# Patient Record
Sex: Male | Born: 1995 | Race: White | Hispanic: No | Marital: Single | Smoking: Never smoker
Health system: Southern US, Community
[De-identification: ages and names within clinical notes are randomized; demographics above are authoritative.]

---
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
## 2016-09-14 ENCOUNTER — Ambulatory Visit (INDEPENDENT_AMBULATORY_CARE_PROVIDER_SITE_OTHER): Payer: BLUE CROSS/BLUE SHIELD | Admitting: Internal Medicine

## 2016-09-14 ENCOUNTER — Encounter: Payer: Self-pay | Admitting: Internal Medicine

## 2016-09-14 ENCOUNTER — Other Ambulatory Visit (INDEPENDENT_AMBULATORY_CARE_PROVIDER_SITE_OTHER): Payer: BLUE CROSS/BLUE SHIELD

## 2016-09-14 VITALS — BP 116/80 | HR 75 | Temp 98.7°F | Ht 72.5 in | Wt 164.0 lb

## 2016-09-14 DIAGNOSIS — Z0000 Encounter for general adult medical examination without abnormal findings: Secondary | ICD-10-CM | POA: Diagnosis not present

## 2016-09-14 DIAGNOSIS — F458 Other somatoform disorders: Secondary | ICD-10-CM

## 2016-09-14 DIAGNOSIS — R079 Chest pain, unspecified: Secondary | ICD-10-CM | POA: Insufficient documentation

## 2016-09-14 DIAGNOSIS — R519 Headache, unspecified: Secondary | ICD-10-CM | POA: Insufficient documentation

## 2016-09-14 DIAGNOSIS — Z23 Encounter for immunization: Secondary | ICD-10-CM

## 2016-09-14 DIAGNOSIS — L723 Sebaceous cyst: Secondary | ICD-10-CM | POA: Diagnosis not present

## 2016-09-14 DIAGNOSIS — G4489 Other headache syndrome: Secondary | ICD-10-CM

## 2016-09-14 DIAGNOSIS — R51 Headache: Secondary | ICD-10-CM

## 2016-09-14 LAB — BASIC METABOLIC PANEL
BUN: 13 mg/dL (ref 6–23)
CO2: 30 mEq/L (ref 19–32)
Calcium: 10.1 mg/dL (ref 8.4–10.5)
Chloride: 104 mEq/L (ref 96–112)
Creatinine, Ser: 1.04 mg/dL (ref 0.40–1.50)
GFR: 97.47 mL/min (ref >60.00)
Glucose, Bld: 98 mg/dL (ref 70–99)
Potassium: 4.9 mEq/L (ref 3.5–5.1)
Sodium: 140 mEq/L (ref 135–145)

## 2016-09-14 LAB — HEPATIC FUNCTION PANEL
ALT: 20 U/L (ref 0–53)
AST: 19 U/L (ref 0–37)
Albumin: 4.9 g/dL (ref 3.5–5.2)
Alkaline Phosphatase: 62 U/L (ref 52–171)
Bilirubin, Direct: 0.2 mg/dL (ref 0.0–0.3)
Total Bilirubin: 0.8 mg/dL (ref 0.2–1.2)
Total Protein: 8 g/dL (ref 6.0–8.3)

## 2016-09-14 LAB — CBC WITH DIFFERENTIAL/PLATELET
Basophils Absolute: 0 10*3/uL (ref 0.0–0.1)
Basophils Relative: 0.6 % (ref 0.0–3.0)
Eosinophils Absolute: 0.2 10*3/uL (ref 0.0–0.7)
Eosinophils Relative: 3 % (ref 0.0–5.0)
HCT: 47.4 % (ref 36.0–49.0)
Hemoglobin: 16.6 g/dL — ABNORMAL HIGH (ref 12.0–16.0)
Lymphocytes Relative: 33.8 % (ref 24.0–48.0)
Lymphs Abs: 2.1 10*3/uL (ref 0.7–4.0)
MCHC: 35.1 g/dL (ref 31.0–37.0)
MCV: 83.4 fl (ref 78.0–98.0)
Monocytes Absolute: 0.4 10*3/uL (ref 0.1–1.0)
Monocytes Relative: 5.8 % (ref 3.0–12.0)
Neutro Abs: 3.5 10*3/uL (ref 1.4–7.7)
Neutrophils Relative %: 56.8 % (ref 43.0–71.0)
Platelets: 242 10*3/uL (ref 150.0–575.0)
RBC: 5.69 Mil/uL (ref 3.80–5.70)
RDW: 12.5 % (ref 11.4–15.5)
WBC: 6.2 10*3/uL (ref 4.5–13.5)

## 2016-09-14 LAB — LIPID PANEL
Cholesterol: 148 mg/dL (ref 0–200)
HDL: 44.6 mg/dL (ref >39.00)
LDL Cholesterol: 92 mg/dL (ref 0–99)
NonHDL: 103.58
Total CHOL/HDL Ratio: 3
Triglycerides: 57 mg/dL (ref 0.0–149.0)
VLDL: 11.4 mg/dL (ref 0.0–40.0)

## 2016-09-14 LAB — URINALYSIS
Bilirubin Urine: NEGATIVE
Hgb urine dipstick: NEGATIVE
Leukocytes, UA: NEGATIVE
Nitrite: NEGATIVE
Specific Gravity, Urine: >=1.03 — AB (ref 1.000–1.030)
Total Protein, Urine: NEGATIVE
Urine Glucose: NEGATIVE
Urobilinogen, UA: 0.2 (ref 0.0–1.0)
pH: 6 (ref 5.0–8.0)

## 2016-09-14 LAB — TSH: TSH: 1.54 u[IU]/mL (ref 0.40–5.00)

## 2016-09-14 MED ORDER — IBUPROFEN 600 MG PO TABS: 600.0000 mg | ORAL_TABLET | Freq: Three times a day (TID) | ORAL | 2 refills | Status: DC | PRN

## 2016-09-14 NOTE — Addendum Note (Signed)
Addended by: Merrilyn PumaSIMMONS, Bosten Newstrom N on: 09/14/2016 03:58 PM   Modules accepted: Orders

## 2016-09-14 NOTE — Progress Notes (Signed)
Subjective:  Patient ID: Walter Blake, male    DOB: [birth year]  Age: 20 y.o. MRN: [id number]  CC: Establish Care   HPI Walter Blake presents for a well exam - new pt C/o HA in the R temple C/o chest pain C/o teeth discomfort    No outpatient prescriptions prior to visit.   No facility-administered medications prior to visit.     ROS Review of Systems  Constitutional: Negative for appetite change, fatigue and unexpected weight change.  HENT: Negative for congestion, nosebleeds, sneezing, sore throat and trouble swallowing.   Eyes: Negative for itching and visual disturbance.  Respiratory: Negative for cough.   Cardiovascular: Negative for chest pain, palpitations and leg swelling.  Gastrointestinal: Negative for abdominal distention, blood in stool, diarrhea and nausea.  Genitourinary: Negative for frequency and hematuria.  Musculoskeletal: Negative for back pain, gait problem, joint swelling and neck pain.  Skin: Negative for rash.  Neurological: Negative for dizziness, tremors, speech difficulty and weakness.  Psychiatric/Behavioral: Negative for agitation, dysphoric mood, sleep disturbance and suicidal ideas. The patient is not nervous/anxious.     Objective:  BP 116/80   Pulse 75   Temp 98.7 F (37.1 C) (Oral)   Ht 6' 0.5" (1.842 m)   Wt 164 lb (74.4 kg)   SpO2 97%   BMI 21.94 kg/m   BP Readings from Last 3 Encounters:  09/14/16 116/80    Wt Readings from Last 3 Encounters:  09/14/16 164 lb (74.4 kg) (66 %, Z= 0.40)*   * Growth percentiles are based on CDC 2-20 Years data.    Physical Exam  Constitutional: He is oriented to person, place, and time. He appears well-developed. No distress.  NAD  HENT:  Mouth/Throat: Oropharynx is clear and moist.  Eyes: Conjunctivae are normal. Pupils are equal, round, and reactive to light.  Neck: Normal range of motion. No JVD present. No thyromegaly present.  Cardiovascular: Normal rate, regular rhythm, normal  heart sounds and intact distal pulses.  Exam reveals no gallop and no friction rub.   No murmur heard. Pulmonary/Chest: Effort normal and breath sounds normal. No respiratory distress. He has no wheezes. He has no rales. He exhibits no tenderness.  Abdominal: Soft. Bowel sounds are normal. He exhibits no distension and no mass. There is no tenderness. There is no rebound and no guarding.  Musculoskeletal: Normal range of motion. He exhibits no edema or tenderness.  Lymphadenopathy:    He has no cervical adenopathy.  Neurological: He is alert and oriented to person, place, and time. He has normal reflexes. No cranial nerve deficit. He exhibits normal muscle tone. He displays a negative Romberg sign. Coordination and gait normal.  Skin: Skin is warm and dry. No rash noted.  Psychiatric: He has a normal mood and affect. His behavior is normal. Judgment and thought content normal.    No results found for: WBC, HGB, HCT, PLT, GLUCOSE, CHOL, TRIG, HDL, LDLDIRECT, LDLCALC, ALT, AST, NA, K, CL, CREATININE, BUN, CO2, TSH, PSA, INR, GLUF, HGBA1C, MICROALBUR  Ct Pelvis W Contrast  Result Date: 04/15/2003 FINDINGS CLINICAL DATA:  ABDOMINAL PAIN, NAUSEA, AND VOMITING.  LEFT SIDED HYDRONEPHROSIS AND RETROPERITONEAL MASS SEEN ON ULTRASOUND.  NOW FOR FURTHER EVALUATION. CT ABDOMEN WITHOUT AND WITH CONTRAST AND CT PELVIS WITH CONTRAST, 04/15/03 CT WAS PERFORMED THROUGH THE ABDOMEN WITHOUT AND WITH CONTRAST AND THROUIGH THE PELVIS WITH CONTRAST.  PATIENT RECEIVED ORAL CONTRAST AND 50 CC OMNIPAQUE 300 IV. THERE IS A 4.0 X 3.3 CM LOW DENSITY  MASS IN THE LEFT RETROPERITONEUM/PARA-AORTIC REGION JUST BELOW THE LEFT RENAL VEIN.  THIS MEASURES 24 HOUNSFIELD UNITS ON THE PRECONTRAST, 30 HOUNSFIELD UNITS ON THE EARLY POST IV CONTRAST IMAGES, AND 42.0 CM ON THE DELAYED IMAGES.  THIS IS COMPATIBLE WITH AN ENHANCING MASS.  THIS DOES CAUSE MARKED LEFT HYDRONEPHROSIS.  THE LEFT KIDNEY DOES ENHANCE SYMMETRICALLY TO THE RIGHT AND  DOES EXCRETE INTO THE DILATED COLLECTING SYSTEM.  NO OTHER MASS IS SEEN.  THIS APPEARS TO BE SEPARATE FROM THE LEFT KIDNEY ALTHOUGH IT DOES COME VERY CLOSE TO THE LOWER POLE OF THE LEFT KIDNEY.  THIS IS REMOTE FROM THE LEFT ADRENAL GLAND. NO FOCAL LESIONS SEEN IN THE LIVER, SPLEEN, PANCREAS, ADRENALS, OR RIGHT KIDNEY.  GALLBLADDER AND BOWEL ARE GROSSLY UNREMARKABLE. IMPRESSION 1.  SOLITARY 4.0 CM LOW DENSITY MASS WITH FLOW ENHANCEMENT AFTER IV CONTRAST.  THERE ARE NO CALCIFICATIONS WITHIN THIS MASS.  THIS IS SEPARATE FROM THE LEFT ADRENAL GLAND AND APPEARS TO BE SEPARATE ALTHOUGH CLOSE TO THE INFERIOR POLE OF THE LEFT KIDNEY.  DIFFERENTIAL CONSIDERATIONS WOULD INCLUDE LYMPHADENOPATHY POSSIBLY FROM LYMPHOMA OR POSSIBLY AN EXTRAADRENAL NEUROBLASTOMA. 2.  SEVERE LEFT HYDRONEPHROSIS.  THIS IS DUE TO THE LEFT RETROPERITONEAL MASS. CT OF THE PELVIS WITH CONTRAST THERE IS A MODERATE AMOUNT OF STOOL IN THE COLON.  BLADDER IS UNREMARKABLE.  A URETERAL JET IS SEEN FROM THE RIGHT URETER BUT NOT THE LEFT ON THIS SINGLE PASS THROUGH THE PELVIS.  NO EVIDENCE OF ADENOPATHY, FREE FLUID, OR FREE AIR. IMPRESSION UNREMARKABLE PELVIC CT.  NO EVIDENCE OF ADENOPATHY IN THE PELVIS. THESE RESULTS WERE DISCUSSED DIRECTLY WITH DR. DAVID RUBIN 04/15/03 AT 12:30 PM.  Ct Abd Wo & W Cm  Result Date: 04/15/2003 FINDINGS CLINICAL DATA:  ABDOMINAL PAIN, NAUSEA, AND VOMITING.  LEFT SIDED HYDRONEPHROSIS AND RETROPERITONEAL MASS SEEN ON ULTRASOUND.  NOW FOR FURTHER EVALUATION. CT ABDOMEN WITHOUT AND WITH CONTRAST AND CT PELVIS WITH CONTRAST, 04/15/03 CT WAS PERFORMED THROUGH THE ABDOMEN WITHOUT AND WITH CONTRAST AND THROUIGH THE PELVIS WITH CONTRAST.  PATIENT RECEIVED ORAL CONTRAST AND 50 CC OMNIPAQUE 300 IV. THERE IS A 4.0 X 3.3 CM LOW DENSITY MASS IN THE LEFT RETROPERITONEUM/PARA-AORTIC REGION JUST BELOW THE LEFT RENAL VEIN.  THIS MEASURES 24 HOUNSFIELD UNITS ON THE PRECONTRAST, 30 HOUNSFIELD UNITS ON THE EARLY POST IV CONTRAST IMAGES, AND 42.0  CM ON THE DELAYED IMAGES.  THIS IS COMPATIBLE WITH AN ENHANCING MASS.  THIS DOES CAUSE MARKED LEFT HYDRONEPHROSIS.  THE LEFT KIDNEY DOES ENHANCE SYMMETRICALLY TO THE RIGHT AND DOES EXCRETE INTO THE DILATED COLLECTING SYSTEM.  NO OTHER MASS IS SEEN.  THIS APPEARS TO BE SEPARATE FROM THE LEFT KIDNEY ALTHOUGH IT DOES COME VERY CLOSE TO THE LOWER POLE OF THE LEFT KIDNEY.  THIS IS REMOTE FROM THE LEFT ADRENAL GLAND. NO FOCAL LESIONS SEEN IN THE LIVER, SPLEEN, PANCREAS, ADRENALS, OR RIGHT KIDNEY.  GALLBLADDER AND BOWEL ARE GROSSLY UNREMARKABLE. IMPRESSION 1.  SOLITARY 4.0 CM LOW DENSITY MASS WITH FLOW ENHANCEMENT AFTER IV CONTRAST.  THERE ARE NO CALCIFICATIONS WITHIN THIS MASS.  THIS IS SEPARATE FROM THE LEFT ADRENAL GLAND AND APPEARS TO BE SEPARATE ALTHOUGH CLOSE TO THE INFERIOR POLE OF THE LEFT KIDNEY.  DIFFERENTIAL CONSIDERATIONS WOULD INCLUDE LYMPHADENOPATHY POSSIBLY FROM LYMPHOMA OR POSSIBLY AN EXTRAADRENAL NEUROBLASTOMA. 2.  SEVERE LEFT HYDRONEPHROSIS.  THIS IS DUE TO THE LEFT RETROPERITONEAL MASS. CT OF THE PELVIS WITH CONTRAST THERE IS A MODERATE AMOUNT OF STOOL IN THE COLON.  BLADDER IS UNREMARKABLE.  A URETERAL JET IS SEEN FROM THE RIGHT  URETER BUT NOT THE LEFT ON THIS SINGLE PASS THROUGH THE PELVIS.  NO EVIDENCE OF ADENOPATHY, FREE FLUID, OR FREE AIR. IMPRESSION UNREMARKABLE PELVIC CT.  NO EVIDENCE OF ADENOPATHY IN THE PELVIS. THESE RESULTS WERE DISCUSSED DIRECTLY WITH DR. DAVID RUBIN 04/15/03 AT 12:30 PM.  [place] Renal/aorta  Result Date: 04/15/2003 FINDINGS CLINICAL DATA:  NAUSEA, VOMITING, ABDOMINAL PAIN. RENAL ULTRASOUND, 04/15/03 THE RIGHT KIDNEY MEASURES 8.6 CM.  THE LEFT KIDNEY IS ENLARGED AT 11.9 CM WITH MARKED LEFT HYDRONEPHROSIS.  ADJACENT TO THE UPJ REGION THERE IS A 3.2 X 3.0 X 3.0 CM ROUNDED SOFT TISSUE LESION.  THIS APPEARS TO BE SEPARATE FROM THE INFERIOR POLE OF THE LEFT KIDNEY IN THE REGION OF THE LEFT UPJ.  I WONDER IF THIS IS ADENOPATHY.  THIS WOULD REQUIRE CT FOR FURTHER EVALUATION. IMAGES OF  THE BLADDER ARE UNREMARKABLE. IMPRESSION MARKED LEFT HYDRONEPHROSIS WITH ABNORMAL SOFT TISSUE ADJACENT TO THE LEFT UPJ, QUESTION ADENOPATHY. RECOMMEND CT FOR FURTHER EVALUATION. THESE RESULTS WERE DISCUSSED DIRECTLY WITH DR.Marland Kitchen. DAVID RUBIN 04/15/03 AT 9:45 AM.   Assessment & Plan:   There are no diagnoses linked to this encounter. Mr. Darrick PennaFields does not currently have medications on file.  No orders of the defined types were placed in this encounter.    Follow-up: No Follow-up on file.  Sonda PrimesAlex [name], MD

## 2016-09-14 NOTE — Assessment & Plan Note (Addendum)
Posterior neck 7x6 mm Removal was offered

## 2016-09-14 NOTE — Assessment & Plan Note (Signed)
9/17 R masseter muscle - pain Ibuprofen prn Relaxation, start exercising

## 2016-09-14 NOTE — Progress Notes (Signed)
Pre visit review using our clinic review tool, if applicable. No additional management support is needed unless otherwise documented below in the visit note. 

## 2016-09-14 NOTE — Assessment & Plan Note (Addendum)
9/17 MSK CXR

## 2016-09-14 NOTE — Patient Instructions (Addendum)
Google masseter muscle pain Sebaceous cyst

## 2016-09-14 NOTE — Assessment & Plan Note (Addendum)
Discussed Start exercising Reduce gaming time masseter muscle pain discussed - Ibuprofen prn

## 2016-09-14 NOTE — Assessment & Plan Note (Signed)
We discussed age appropriate health related issues, including available/recomended screening tests and vaccinations. We discussed a need for adhering to healthy diet and exercise. Labs were reviewed/ordered. All questions were answered. Age and sex related issues discussed (safe sex, seat belt use, etc.). Gardasil suggested, info given.  

## 2016-12-18 DIAGNOSIS — L720 Epidermal cyst: Secondary | ICD-10-CM | POA: Diagnosis not present

## 2018-09-10 ENCOUNTER — Encounter: Payer: BLUE CROSS/BLUE SHIELD | Admitting: Internal Medicine

## 2018-09-10 ENCOUNTER — Ambulatory Visit (INDEPENDENT_AMBULATORY_CARE_PROVIDER_SITE_OTHER): Payer: BLUE CROSS/BLUE SHIELD | Admitting: Internal Medicine

## 2018-09-10 ENCOUNTER — Encounter: Payer: Self-pay | Admitting: Internal Medicine

## 2018-09-10 VITALS — BP 116/74 | HR 64 | Temp 98.0°F | Ht 72.05 in | Wt 170.0 lb

## 2018-09-10 DIAGNOSIS — R0981 Nasal congestion: Secondary | ICD-10-CM

## 2018-09-10 DIAGNOSIS — Z23 Encounter for immunization: Secondary | ICD-10-CM | POA: Diagnosis not present

## 2018-09-10 DIAGNOSIS — Z0000 Encounter for general adult medical examination without abnormal findings: Secondary | ICD-10-CM | POA: Diagnosis not present

## 2018-09-10 NOTE — Progress Notes (Signed)
Subjective:  Patient ID: Walter Blake, male    DOB: 1996/05/05  Age: 22 y.o. MRN: [id number]  CC: No chief complaint on file.   HPI Walter Blake presents for a well exam C/o nasal congestion x years C/o trouble focusing. C/o fam h/o ADD  Outpatient Medications Prior to Visit  Medication Sig Dispense Refill  . ibuprofen (ADVIL,MOTRIN) 600 MG tablet Take 1 tablet (600 mg total) by mouth every 8 (eight) hours as needed. Headache 60 tablet 2   No facility-administered medications prior to visit.     ROS: Review of Systems  Constitutional: Negative for appetite change, fatigue and unexpected weight change.  HENT: Negative for congestion, nosebleeds, sneezing, sore throat and trouble swallowing.   Eyes: Negative for itching and visual disturbance.  Respiratory: Negative for cough.   Cardiovascular: Negative for chest pain, palpitations and leg swelling.  Gastrointestinal: Negative for abdominal distention, blood in stool, diarrhea and nausea.  Genitourinary: Negative for frequency and hematuria.  Musculoskeletal: Negative for back pain, gait problem, joint swelling and neck pain.  Skin: Negative for rash.  Neurological: Negative for dizziness, tremors, speech difficulty and weakness.  Psychiatric/Behavioral: Negative for agitation, dysphoric mood, sleep disturbance and suicidal ideas. The patient is not nervous/anxious.     Objective:  BP 116/74 (BP Location: Left Arm, Patient Position: Sitting, Cuff Size: Normal)   Pulse 64   Temp 98 F (36.7 C) (Oral)   Ht 6' 0.05" (1.83 m)   Wt 170 lb (77.1 kg)   SpO2 97%   BMI 23.02 kg/m   BP Readings from Last 3 Encounters:  09/10/18 116/74  09/14/16 116/80    Wt Readings from Last 3 Encounters:  09/10/18 170 lb (77.1 kg)  09/14/16 164 lb (74.4 kg) (66 %, Z= 0.40)*   * Growth percentiles are based on CDC (Boys, 2-20 Years) data.    Physical Exam  Constitutional: He is oriented to person, place, and time. He appears  well-developed. No distress.  NAD  HENT:  Mouth/Throat: Oropharynx is clear and moist.  Eyes: Pupils are equal, round, and reactive to light. Conjunctivae are normal.  Neck: Normal range of motion. No JVD present. No thyromegaly present.  Cardiovascular: Normal rate, regular rhythm, normal heart sounds and intact distal pulses. Exam reveals no gallop and no friction rub.  No murmur heard. Pulmonary/Chest: Effort normal and breath sounds normal. No respiratory distress. He has no wheezes. He has no rales. He exhibits no tenderness.  Abdominal: Soft. Bowel sounds are normal. He exhibits no distension and no mass. There is no tenderness. There is no rebound and no guarding.  Musculoskeletal: Normal range of motion. He exhibits no edema or tenderness.  Lymphadenopathy:    He has no cervical adenopathy.  Neurological: He is alert and oriented to person, place, and time. He has normal reflexes. No cranial nerve deficit. He exhibits normal muscle tone. He displays a negative Romberg sign. Coordination and gait normal.  Skin: Skin is warm and dry. No rash noted.  Psychiatric: He has a normal mood and affect. His behavior is normal. Judgment and thought content normal.    Lab Results  Component Value Date   WBC 6.2 09/14/2016   HGB 16.6 (H) 09/14/2016   HCT 47.4 09/14/2016   PLT 242.0 09/14/2016   GLUCOSE 98 09/14/2016   CHOL 148 09/14/2016   TRIG 57.0 09/14/2016   HDL 44.60 09/14/2016   LDLCALC 92 09/14/2016   ALT 20 09/14/2016   AST 19 09/14/2016  NA 140 09/14/2016   K 4.9 09/14/2016   CL 104 09/14/2016   CREATININE 1.04 09/14/2016   BUN 13 09/14/2016   CO2 30 09/14/2016   TSH 1.54 09/14/2016    Ct Pelvis W Contrast  Result Date: 04/15/2003 FINDINGS CLINICAL DATA:  ABDOMINAL PAIN, NAUSEA, AND VOMITING.  LEFT SIDED HYDRONEPHROSIS AND RETROPERITONEAL MASS SEEN ON ULTRASOUND.  NOW FOR FURTHER EVALUATION. CT ABDOMEN WITHOUT AND WITH CONTRAST AND CT PELVIS WITH CONTRAST, 04/15/03 CT  WAS PERFORMED THROUGH THE ABDOMEN WITHOUT AND WITH CONTRAST AND THROUIGH THE PELVIS WITH CONTRAST.  PATIENT RECEIVED ORAL CONTRAST AND 50 CC OMNIPAQUE 300 IV. THERE IS A 4.0 X 3.3 CM LOW DENSITY MASS IN THE LEFT RETROPERITONEUM/PARA-AORTIC REGION JUST BELOW THE LEFT RENAL VEIN.  THIS MEASURES 24 HOUNSFIELD UNITS ON THE PRECONTRAST, 30 HOUNSFIELD UNITS ON THE EARLY POST IV CONTRAST IMAGES, AND 42.0 CM ON THE DELAYED IMAGES.  THIS IS COMPATIBLE WITH AN ENHANCING MASS.  THIS DOES CAUSE MARKED LEFT HYDRONEPHROSIS.  THE LEFT KIDNEY DOES ENHANCE SYMMETRICALLY TO THE RIGHT AND DOES EXCRETE INTO THE DILATED COLLECTING SYSTEM.  NO OTHER MASS IS SEEN.  THIS APPEARS TO BE SEPARATE FROM THE LEFT KIDNEY ALTHOUGH IT DOES COME VERY CLOSE TO THE LOWER POLE OF THE LEFT KIDNEY.  THIS IS REMOTE FROM THE LEFT ADRENAL GLAND. NO FOCAL LESIONS SEEN IN THE LIVER, SPLEEN, PANCREAS, ADRENALS, OR RIGHT KIDNEY.  GALLBLADDER AND BOWEL ARE GROSSLY UNREMARKABLE. IMPRESSION 1.  SOLITARY 4.0 CM LOW DENSITY MASS WITH FLOW ENHANCEMENT AFTER IV CONTRAST.  THERE ARE NO CALCIFICATIONS WITHIN THIS MASS.  THIS IS SEPARATE FROM THE LEFT ADRENAL GLAND AND APPEARS TO BE SEPARATE ALTHOUGH CLOSE TO THE INFERIOR POLE OF THE LEFT KIDNEY.  DIFFERENTIAL CONSIDERATIONS WOULD INCLUDE LYMPHADENOPATHY POSSIBLY FROM LYMPHOMA OR POSSIBLY AN EXTRAADRENAL NEUROBLASTOMA. 2.  SEVERE LEFT HYDRONEPHROSIS.  THIS IS DUE TO THE LEFT RETROPERITONEAL MASS. CT OF THE PELVIS WITH CONTRAST THERE IS A MODERATE AMOUNT OF STOOL IN THE COLON.  BLADDER IS UNREMARKABLE.  A URETERAL JET IS SEEN FROM THE RIGHT URETER BUT NOT THE LEFT ON THIS SINGLE PASS THROUGH THE PELVIS.  NO EVIDENCE OF ADENOPATHY, FREE FLUID, OR FREE AIR. IMPRESSION UNREMARKABLE PELVIC CT.  NO EVIDENCE OF ADENOPATHY IN THE PELVIS. THESE RESULTS WERE DISCUSSED DIRECTLY WITH DR. DAVID RUBIN 04/15/03 AT 12:30 PM.  Ct Abd Wo & W Cm  Result Date: 04/15/2003 FINDINGS CLINICAL DATA:  ABDOMINAL PAIN, NAUSEA, AND VOMITING.   LEFT SIDED HYDRONEPHROSIS AND RETROPERITONEAL MASS SEEN ON ULTRASOUND.  NOW FOR FURTHER EVALUATION. CT ABDOMEN WITHOUT AND WITH CONTRAST AND CT PELVIS WITH CONTRAST, 04/15/03 CT WAS PERFORMED THROUGH THE ABDOMEN WITHOUT AND WITH CONTRAST AND THROUIGH THE PELVIS WITH CONTRAST.  PATIENT RECEIVED ORAL CONTRAST AND 50 CC OMNIPAQUE 300 IV. THERE IS A 4.0 X 3.3 CM LOW DENSITY MASS IN THE LEFT RETROPERITONEUM/PARA-AORTIC REGION JUST BELOW THE LEFT RENAL VEIN.  THIS MEASURES 24 HOUNSFIELD UNITS ON THE PRECONTRAST, 30 HOUNSFIELD UNITS ON THE EARLY POST IV CONTRAST IMAGES, AND 42.0 CM ON THE DELAYED IMAGES.  THIS IS COMPATIBLE WITH AN ENHANCING MASS.  THIS DOES CAUSE MARKED LEFT HYDRONEPHROSIS.  THE LEFT KIDNEY DOES ENHANCE SYMMETRICALLY TO THE RIGHT AND DOES EXCRETE INTO THE DILATED COLLECTING SYSTEM.  NO OTHER MASS IS SEEN.  THIS APPEARS TO BE SEPARATE FROM THE LEFT KIDNEY ALTHOUGH IT DOES COME VERY CLOSE TO THE LOWER POLE OF THE LEFT KIDNEY.  THIS IS REMOTE FROM THE LEFT ADRENAL GLAND. NO FOCAL LESIONS SEEN IN THE LIVER, SPLEEN,  PANCREAS, ADRENALS, OR RIGHT KIDNEY.  GALLBLADDER AND BOWEL ARE GROSSLY UNREMARKABLE. IMPRESSION 1.  SOLITARY 4.0 CM LOW DENSITY MASS WITH FLOW ENHANCEMENT AFTER IV CONTRAST.  THERE ARE NO CALCIFICATIONS WITHIN THIS MASS.  THIS IS SEPARATE FROM THE LEFT ADRENAL GLAND AND APPEARS TO BE SEPARATE ALTHOUGH CLOSE TO THE INFERIOR POLE OF THE LEFT KIDNEY.  DIFFERENTIAL CONSIDERATIONS WOULD INCLUDE LYMPHADENOPATHY POSSIBLY FROM LYMPHOMA OR POSSIBLY AN EXTRAADRENAL NEUROBLASTOMA. 2.  SEVERE LEFT HYDRONEPHROSIS.  THIS IS DUE TO THE LEFT RETROPERITONEAL MASS. CT OF THE PELVIS WITH CONTRAST THERE IS A MODERATE AMOUNT OF STOOL IN THE COLON.  BLADDER IS UNREMARKABLE.  A URETERAL JET IS SEEN FROM THE RIGHT URETER BUT NOT THE LEFT ON THIS SINGLE PASS THROUGH THE PELVIS.  NO EVIDENCE OF ADENOPATHY, FREE FLUID, OR FREE AIR. IMPRESSION UNREMARKABLE PELVIC CT.  NO EVIDENCE OF ADENOPATHY IN THE PELVIS. THESE RESULTS  WERE DISCUSSED DIRECTLY WITH DR. DAVID RUBIN 04/15/03 AT 12:30 PM.  [place] Renal/aorta  Result Date: 04/15/2003 FINDINGS CLINICAL DATA:  NAUSEA, VOMITING, ABDOMINAL PAIN. RENAL ULTRASOUND, 04/15/03 THE RIGHT KIDNEY MEASURES 8.6 CM.  THE LEFT KIDNEY IS ENLARGED AT 11.9 CM WITH MARKED LEFT HYDRONEPHROSIS.  ADJACENT TO THE UPJ REGION THERE IS A 3.2 X 3.0 X 3.0 CM ROUNDED SOFT TISSUE LESION.  THIS APPEARS TO BE SEPARATE FROM THE INFERIOR POLE OF THE LEFT KIDNEY IN THE REGION OF THE LEFT UPJ.  I WONDER IF THIS IS ADENOPATHY.  THIS WOULD REQUIRE CT FOR FURTHER EVALUATION. IMAGES OF THE BLADDER ARE UNREMARKABLE. IMPRESSION MARKED LEFT HYDRONEPHROSIS WITH ABNORMAL SOFT TISSUE ADJACENT TO THE LEFT UPJ, QUESTION ADENOPATHY. RECOMMEND CT FOR FURTHER EVALUATION. THESE RESULTS WERE DISCUSSED DIRECTLY WITH DR.Marland Kitchen DAVID RUBIN 04/15/03 AT 9:45 AM.   Assessment & Plan:   There are no diagnoses linked to this encounter.   No orders of the defined types were placed in this encounter.    Follow-up: No follow-ups on file.  Sonda Primes, MD

## 2018-09-10 NOTE — Assessment & Plan Note (Signed)
Chronic. 

## 2018-09-10 NOTE — Patient Instructions (Addendum)
Vitamin D3 2000 iu a day - winter/spring Vitamin B complex 1 a day - when stressed Use coffe or tea to help with focus

## 2018-09-10 NOTE — Assessment & Plan Note (Signed)
We discussed age appropriate health related issues, including available/recomended screening tests and vaccinations. We discussed a need for adhering to healthy diet and exercise. Labs were declined by the pt as well as testes exam  All questions were answered. Flu shot

## 2018-10-07 DIAGNOSIS — J342 Deviated nasal septum: Secondary | ICD-10-CM | POA: Diagnosis not present

## 2018-10-07 DIAGNOSIS — R0981 Nasal congestion: Secondary | ICD-10-CM | POA: Diagnosis not present

## 2020-01-06 DIAGNOSIS — Z20828 Contact with and (suspected) exposure to other viral communicable diseases: Secondary | ICD-10-CM | POA: Diagnosis not present

## 2020-01-07 DIAGNOSIS — Z20828 Contact with and (suspected) exposure to other viral communicable diseases: Secondary | ICD-10-CM | POA: Diagnosis not present

## 2021-01-26 ENCOUNTER — Telehealth: Payer: Self-pay | Admitting: Internal Medicine

## 2021-01-26 NOTE — Telephone Encounter (Signed)
Patient hasn't been seen since 2018, wondering if he can re-establish care

## 2021-01-26 NOTE — Telephone Encounter (Signed)
Ok Thx 

## 2021-02-09 ENCOUNTER — Ambulatory Visit: Payer: 59 | Admitting: Internal Medicine

## 2021-02-09 ENCOUNTER — Encounter: Payer: Self-pay | Admitting: Internal Medicine

## 2021-02-09 ENCOUNTER — Other Ambulatory Visit: Payer: Self-pay

## 2021-02-09 VITALS — BP 118/72 | HR 81 | Temp 98.1°F | Ht 72.25 in | Wt 196.2 lb

## 2021-02-09 DIAGNOSIS — Z0000 Encounter for general adult medical examination without abnormal findings: Secondary | ICD-10-CM

## 2021-02-09 MED ORDER — DOXYCYCLINE HYCLATE 100 MG PO TABS: 100.0000 mg | ORAL_TABLET | Freq: Two times a day (BID) | ORAL | 1 refills | Status: DC

## 2021-02-09 MED ORDER — TRIAMCINOLONE ACETONIDE 0.5 % EX OINT: 1.0000 "application " | TOPICAL_OINTMENT | Freq: Two times a day (BID) | CUTANEOUS | 1 refills | Status: DC

## 2021-02-09 MED ORDER — METHYLPREDNISOLONE 4 MG PO TBPK: ORAL_TABLET | ORAL | 0 refills | Status: DC

## 2021-02-09 MED ORDER — VITAMIN D 25 MCG (1000 UNIT) PO TABS: 1000.0000 [IU] | ORAL_TABLET | Freq: Every day | ORAL | 11 refills | Status: DC

## 2021-02-09 MED ORDER — CLOTRIMAZOLE-BETAMETHASONE 1-0.05 % EX CREA: 1.0000 "application " | TOPICAL_CREAM | Freq: Two times a day (BID) | CUTANEOUS | 1 refills | Status: DC

## 2021-02-09 NOTE — Progress Notes (Signed)
Subjective:  Patient ID: Walter Blake, male    DOB: May 14, 1996  Age: 25 y.o. MRN: [id number]  CC: Establish Care   HPI Walter Blake presents for a well exam  Walter Blake is preparing to do a [place] to see [place] (from [place] to [employer]) in April of this year.  This should take him  over 30 days to accomplish. He has been hiking a lot.  He is complaining of occasional anal irritation that he thinks is related to hemorrhoids.  He is complaining of occasional chafing.  Outpatient Medications Prior to Visit  Medication Sig Dispense Refill  . ibuprofen (ADVIL,MOTRIN) 600 MG tablet Take 1 tablet (600 mg total) by mouth every 8 (eight) hours as needed. Headache (Patient not taking: Reported on 02/09/2021) 60 tablet 2   No facility-administered medications prior to visit.    ROS: Review of Systems  Constitutional: Negative for appetite change, fatigue and unexpected weight change.  HENT: Negative for congestion, nosebleeds, sneezing, sore throat and trouble swallowing.   Eyes: Negative for itching and visual disturbance.  Respiratory: Negative for cough.   Cardiovascular: Negative for chest pain, palpitations and leg swelling.  Gastrointestinal: Negative for abdominal distention, abdominal pain, blood in stool, diarrhea, nausea and rectal pain.  Genitourinary: Negative for frequency, genital sores and hematuria.  Musculoskeletal: Negative for back pain, gait problem, joint swelling and neck pain.  Skin: Negative for rash.  Neurological: Negative for dizziness, tremors, speech difficulty and weakness.  Psychiatric/Behavioral: Negative for agitation, dysphoric mood and sleep disturbance. The patient is not nervous/anxious.     Objective:  BP 118/72 (BP Location: Left Arm)   Pulse 81   Temp 98.1 F (36.7 C) (Oral)   Ht 6' 0.25" (1.835 m)   Wt 196 lb 3.2 oz (89 kg)   SpO2 98%   BMI 26.43 kg/m   BP Readings from Last 3 Encounters:  02/09/21 118/72  09/10/18 116/74   09/14/16 116/80    Wt Readings from Last 3 Encounters:  02/09/21 196 lb 3.2 oz (89 kg)  09/10/18 170 lb (77.1 kg)  09/14/16 164 lb (74.4 kg) (66 %, Z= 0.40)*   * Growth percentiles are based on CDC (Boys, 2-20 Years) data.    Physical Exam Constitutional:      General: He is not in acute distress.    Appearance: He is well-developed.     Comments: NAD  HENT:     Mouth/Throat:     Mouth: Oropharynx is clear and moist.  Eyes:     Conjunctiva/sclera: Conjunctivae normal.     Pupils: Pupils are equal, round, and reactive to light.  Neck:     Thyroid: No thyromegaly.     Vascular: No JVD.  Cardiovascular:     Rate and Rhythm: Normal rate and regular rhythm.     Pulses: Intact distal pulses.     Heart sounds: Normal heart sounds. No murmur heard. No friction rub. No gallop.   Pulmonary:     Effort: Pulmonary effort is normal. No respiratory distress.     Breath sounds: Normal breath sounds. No wheezing or rales.  Chest:     Chest wall: No tenderness.  Abdominal:     General: Bowel sounds are normal. There is no distension.     Palpations: Abdomen is soft. There is no mass.     Tenderness: There is no abdominal tenderness. There is no guarding or rebound.  Genitourinary:    Penis: Normal.      Testes:  Normal.  Musculoskeletal:        General: No tenderness or edema. Normal range of motion.     Cervical back: Normal range of motion.  Lymphadenopathy:     Cervical: No cervical adenopathy.  Skin:    General: Skin is warm and dry.     Findings: No rash.  Neurological:     Mental Status: He is alert and oriented to person, place, and time.     Cranial Nerves: No cranial nerve deficit.     Motor: No abnormal muscle tone.     Coordination: He displays a negative Romberg sign. Coordination normal.     Gait: Gait normal.     Deep Tendon Reflexes: Reflexes are normal and symmetric.  Psychiatric:        Mood and Affect: Mood and affect normal.        Behavior: Behavior  normal.        Thought Content: Thought content normal.        Judgment: Judgment normal.   Normal genitals.  Normal perianal and anal area.  No external hemorrhoids noted.  No skin rashes at present  Lab Results  Component Value Date   WBC 6.2 09/14/2016   HGB 16.6 (H) 09/14/2016   HCT 47.4 09/14/2016   PLT 242.0 09/14/2016   GLUCOSE 98 09/14/2016   CHOL 148 09/14/2016   TRIG 57.0 09/14/2016   HDL 44.60 09/14/2016   LDLCALC 92 09/14/2016   ALT 20 09/14/2016   AST 19 09/14/2016   NA 140 09/14/2016   K 4.9 09/14/2016   CL 104 09/14/2016   CREATININE 1.04 09/14/2016   BUN 13 09/14/2016   CO2 30 09/14/2016   TSH 1.54 09/14/2016    CT Pelvis W Contrast  Result Date: 04/15/2003 FINDINGS CLINICAL DATA:  ABDOMINAL PAIN, NAUSEA, AND VOMITING.  LEFT SIDED HYDRONEPHROSIS AND RETROPERITONEAL MASS SEEN ON ULTRASOUND.  NOW FOR FURTHER EVALUATION. CT ABDOMEN WITHOUT AND WITH CONTRAST AND CT PELVIS WITH CONTRAST, 04/15/03 CT WAS PERFORMED THROUGH THE ABDOMEN WITHOUT AND WITH CONTRAST AND THROUIGH THE PELVIS WITH CONTRAST.  PATIENT RECEIVED ORAL CONTRAST AND 50 CC OMNIPAQUE 300 IV. THERE IS A 4.0 X 3.3 CM LOW DENSITY MASS IN THE LEFT RETROPERITONEUM/PARA-AORTIC REGION JUST BELOW THE LEFT RENAL VEIN.  THIS MEASURES 24 HOUNSFIELD UNITS ON THE PRECONTRAST, 30 HOUNSFIELD UNITS ON THE EARLY POST IV CONTRAST IMAGES, AND 42.0 CM ON THE DELAYED IMAGES.  THIS IS COMPATIBLE WITH AN ENHANCING MASS.  THIS DOES CAUSE MARKED LEFT HYDRONEPHROSIS.  THE LEFT KIDNEY DOES ENHANCE SYMMETRICALLY TO THE RIGHT AND DOES EXCRETE INTO THE DILATED COLLECTING SYSTEM.  NO OTHER MASS IS SEEN.  THIS APPEARS TO BE SEPARATE FROM THE LEFT KIDNEY ALTHOUGH IT DOES COME VERY CLOSE TO THE LOWER POLE OF THE LEFT KIDNEY.  THIS IS REMOTE FROM THE LEFT ADRENAL GLAND. NO FOCAL LESIONS SEEN IN THE LIVER, SPLEEN, PANCREAS, ADRENALS, OR RIGHT KIDNEY.  GALLBLADDER AND BOWEL ARE GROSSLY UNREMARKABLE. IMPRESSION 1.  SOLITARY 4.0 CM LOW DENSITY MASS  WITH FLOW ENHANCEMENT AFTER IV CONTRAST.  THERE ARE NO CALCIFICATIONS WITHIN THIS MASS.  THIS IS SEPARATE FROM THE LEFT ADRENAL GLAND AND APPEARS TO BE SEPARATE ALTHOUGH CLOSE TO THE INFERIOR POLE OF THE LEFT KIDNEY.  DIFFERENTIAL CONSIDERATIONS WOULD INCLUDE LYMPHADENOPATHY POSSIBLY FROM LYMPHOMA OR POSSIBLY AN EXTRAADRENAL NEUROBLASTOMA. 2.  SEVERE LEFT HYDRONEPHROSIS.  THIS IS DUE TO THE LEFT RETROPERITONEAL MASS. CT OF THE PELVIS WITH CONTRAST THERE IS A MODERATE AMOUNT OF STOOL IN THE COLON.  BLADDER IS UNREMARKABLE.  A URETERAL JET IS SEEN FROM THE RIGHT URETER BUT NOT THE LEFT ON THIS SINGLE PASS THROUGH THE PELVIS.  NO EVIDENCE OF ADENOPATHY, FREE FLUID, OR FREE AIR. IMPRESSION UNREMARKABLE PELVIC CT.  NO EVIDENCE OF ADENOPATHY IN THE PELVIS. THESE RESULTS WERE DISCUSSED DIRECTLY WITH DR. DAVID RUBIN 04/15/03 AT 12:30 PM.  CT Abd Wo & W Cm  Result Date: 04/15/2003 FINDINGS CLINICAL DATA:  ABDOMINAL PAIN, NAUSEA, AND VOMITING.  LEFT SIDED HYDRONEPHROSIS AND RETROPERITONEAL MASS SEEN ON ULTRASOUND.  NOW FOR FURTHER EVALUATION. CT ABDOMEN WITHOUT AND WITH CONTRAST AND CT PELVIS WITH CONTRAST, 04/15/03 CT WAS PERFORMED THROUGH THE ABDOMEN WITHOUT AND WITH CONTRAST AND THROUIGH THE PELVIS WITH CONTRAST.  PATIENT RECEIVED ORAL CONTRAST AND 50 CC OMNIPAQUE 300 IV. THERE IS A 4.0 X 3.3 CM LOW DENSITY MASS IN THE LEFT RETROPERITONEUM/PARA-AORTIC REGION JUST BELOW THE LEFT RENAL VEIN.  THIS MEASURES 24 HOUNSFIELD UNITS ON THE PRECONTRAST, 30 HOUNSFIELD UNITS ON THE EARLY POST IV CONTRAST IMAGES, AND 42.0 CM ON THE DELAYED IMAGES.  THIS IS COMPATIBLE WITH AN ENHANCING MASS.  THIS DOES CAUSE MARKED LEFT HYDRONEPHROSIS.  THE LEFT KIDNEY DOES ENHANCE SYMMETRICALLY TO THE RIGHT AND DOES EXCRETE INTO THE DILATED COLLECTING SYSTEM.  NO OTHER MASS IS SEEN.  THIS APPEARS TO BE SEPARATE FROM THE LEFT KIDNEY ALTHOUGH IT DOES COME VERY CLOSE TO THE LOWER POLE OF THE LEFT KIDNEY.  THIS IS REMOTE FROM THE LEFT ADRENAL GLAND. NO  FOCAL LESIONS SEEN IN THE LIVER, SPLEEN, PANCREAS, ADRENALS, OR RIGHT KIDNEY.  GALLBLADDER AND BOWEL ARE GROSSLY UNREMARKABLE. IMPRESSION 1.  SOLITARY 4.0 CM LOW DENSITY MASS WITH FLOW ENHANCEMENT AFTER IV CONTRAST.  THERE ARE NO CALCIFICATIONS WITHIN THIS MASS.  THIS IS SEPARATE FROM THE LEFT ADRENAL GLAND AND APPEARS TO BE SEPARATE ALTHOUGH CLOSE TO THE INFERIOR POLE OF THE LEFT KIDNEY.  DIFFERENTIAL CONSIDERATIONS WOULD INCLUDE LYMPHADENOPATHY POSSIBLY FROM LYMPHOMA OR POSSIBLY AN EXTRAADRENAL NEUROBLASTOMA. 2.  SEVERE LEFT HYDRONEPHROSIS.  THIS IS DUE TO THE LEFT RETROPERITONEAL MASS. CT OF THE PELVIS WITH CONTRAST THERE IS A MODERATE AMOUNT OF STOOL IN THE COLON.  BLADDER IS UNREMARKABLE.  A URETERAL JET IS SEEN FROM THE RIGHT URETER BUT NOT THE LEFT ON THIS SINGLE PASS THROUGH THE PELVIS.  NO EVIDENCE OF ADENOPATHY, FREE FLUID, OR FREE AIR. IMPRESSION UNREMARKABLE PELVIC CT.  NO EVIDENCE OF ADENOPATHY IN THE PELVIS. THESE RESULTS WERE DISCUSSED DIRECTLY WITH DR. DAVID RUBIN 04/15/03 AT 12:30 PM.  US RENAL/AORTA  Result Date: 04/15/2003 FINDINGS CLINICAL DATA:  NAUSEA, VOMITING, ABDOMINAL PAIN. RENAL ULTRASOUND, 04/15/03 THE RIGHT KIDNEY MEASURES 8.6 CM.  THE LEFT KIDNEY IS ENLARGED AT 11.9 CM WITH MARKED LEFT HYDRONEPHROSIS.  ADJACENT TO THE UPJ REGION THERE IS A 3.2 X 3.0 X 3.0 CM ROUNDED SOFT TISSUE LESION.  THIS APPEARS TO BE SEPARATE FROM THE INFERIOR POLE OF THE LEFT KIDNEY IN THE REGION OF THE LEFT UPJ.  I WONDER IF THIS IS ADENOPATHY.  THIS WOULD REQUIRE CT FOR FURTHER EVALUATION. IMAGES OF THE BLADDER ARE UNREMARKABLE. IMPRESSION MARKED LEFT HYDRONEPHROSIS WITH ABNORMAL SOFT TISSUE ADJACENT TO THE LEFT UPJ, QUESTION ADENOPATHY. RECOMMEND CT FOR FURTHER EVALUATION. THESE RESULTS WERE DISCUSSED DIRECTLY WITH DR.Marland Kitchen DAVID RUBIN 04/15/03 AT 9:45 AM.   Assessment & Plan:    Sonda Primes, MD

## 2021-02-12 NOTE — Assessment & Plan Note (Signed)
  We discussed age appropriate health related issues, including available/recomended screening tests and vaccinations. Labs were ordered to be later reviewed . All questions were answered. We discussed one or more of the following - seat belt use, use of sunscreen/sun exposure exercise, safe sex, fall risk reduction, second hand smoke exposure, firearm use and storage, seat belt use, a need for adhering to healthy diet and exercise. Labs were ordered.  Eulan declined HIV test since he has not been sexually active.  All questions were answered. 2/22  Tyrick is preparing to do a [place] to [place] (from [place] to [employer]) in April of this year.  This should take him  over 30 days to accomplish. He has been hiking a lot.  We discussed Lyme disease prophylaxis.  Just in case I gave him prescription for doxycycline, Lotrisone, and triamcinolone ointment to take with him on a hike.

## 2021-08-04 ENCOUNTER — Ambulatory Visit (INDEPENDENT_AMBULATORY_CARE_PROVIDER_SITE_OTHER): Payer: 59 | Admitting: Internal Medicine

## 2021-08-04 ENCOUNTER — Encounter: Payer: Self-pay | Admitting: Internal Medicine

## 2021-08-04 ENCOUNTER — Other Ambulatory Visit: Payer: Self-pay

## 2021-08-04 DIAGNOSIS — M546 Pain in thoracic spine: Secondary | ICD-10-CM | POA: Diagnosis not present

## 2021-08-04 DIAGNOSIS — Z0000 Encounter for general adult medical examination without abnormal findings: Secondary | ICD-10-CM

## 2021-08-04 LAB — COMPREHENSIVE METABOLIC PANEL
ALT: 19 U/L (ref 0–53)
AST: 19 U/L (ref 0–37)
Albumin: 4.6 g/dL (ref 3.5–5.2)
Alkaline Phosphatase: 45 U/L (ref 39–117)
BUN: 20 mg/dL (ref 6–23)
CO2: 28 mEq/L (ref 19–32)
Calcium: 10 mg/dL (ref 8.4–10.5)
Chloride: 101 mEq/L (ref 96–112)
Creatinine, Ser: 0.92 mg/dL (ref 0.40–1.50)
GFR: 116.69 mL/min (ref >60.00)
Glucose, Bld: 94 mg/dL (ref 70–99)
Potassium: 4.1 mEq/L (ref 3.5–5.1)
Sodium: 138 mEq/L (ref 135–145)
Total Bilirubin: 0.6 mg/dL (ref 0.2–1.2)
Total Protein: 7.8 g/dL (ref 6.0–8.3)

## 2021-08-04 LAB — CBC WITH DIFFERENTIAL/PLATELET
Basophils Absolute: 0 10*3/uL (ref 0.0–0.1)
Basophils Relative: 0.3 % (ref 0.0–3.0)
Eosinophils Absolute: 0.2 10*3/uL (ref 0.0–0.7)
Eosinophils Relative: 2.7 % (ref 0.0–5.0)
HCT: 45.5 % (ref 39.0–52.0)
Hemoglobin: 15.1 g/dL (ref 13.0–17.0)
Lymphocytes Relative: 37.8 % (ref 12.0–46.0)
Lymphs Abs: 2.8 10*3/uL (ref 0.7–4.0)
MCHC: 33.3 g/dL (ref 30.0–36.0)
MCV: 86.9 fl (ref 78.0–100.0)
Monocytes Absolute: 0.4 10*3/uL (ref 0.1–1.0)
Monocytes Relative: 5.7 % (ref 3.0–12.0)
Neutro Abs: 3.9 10*3/uL (ref 1.4–7.7)
Neutrophils Relative %: 53.5 % (ref 43.0–77.0)
Platelets: 241 10*3/uL (ref 150.0–400.0)
RBC: 5.24 Mil/uL (ref 4.22–5.81)
RDW: 13.3 % (ref 11.5–15.5)
WBC: 7.4 10*3/uL (ref 4.0–10.5)

## 2021-08-04 LAB — URINALYSIS
Bilirubin Urine: NEGATIVE
Hgb urine dipstick: NEGATIVE
Ketones, ur: NEGATIVE
Leukocytes,Ua: NEGATIVE
Nitrite: NEGATIVE
Specific Gravity, Urine: 1.025 (ref 1.000–1.030)
Total Protein, Urine: NEGATIVE
Urine Glucose: NEGATIVE
Urobilinogen, UA: 0.2 (ref 0.0–1.0)
pH: 6 (ref 5.0–8.0)

## 2021-08-04 LAB — LIPID PANEL
Cholesterol: 177 mg/dL (ref 0–200)
HDL: 57.3 mg/dL (ref >39.00)
LDL Cholesterol: 104 mg/dL — ABNORMAL HIGH (ref 0–99)
NonHDL: 119.57
Total CHOL/HDL Ratio: 3
Triglycerides: 77 mg/dL (ref 0.0–149.0)
VLDL: 15.4 mg/dL (ref 0.0–40.0)

## 2021-08-04 LAB — TSH: TSH: 3.81 u[IU]/mL (ref 0.35–5.50)

## 2021-08-04 NOTE — Assessment & Plan Note (Signed)

## 2021-08-04 NOTE — Addendum Note (Signed)
Addended by: Waldemar Dickens B on: 08/04/2021 08:52 AM   Modules accepted: Orders

## 2021-08-04 NOTE — Assessment & Plan Note (Signed)
MS[facility] - due to a long hike Ibuprofen 600 mg bid Sports Med ref

## 2021-08-04 NOTE — Progress Notes (Signed)
Subjective:  Patient ID: Walter Blake, male    DOB: Nov 05, 1996  Age: 25 y.o. MRN: [id number]  CC: Annual Exam   HPI Walter Blake presents for a well exam  Outpatient Medications Prior to Visit  Medication Sig Dispense Refill   cholecalciferol (VITAMIN D3) 25 MCG (1000 UNIT) tablet Take 1 tablet (1,000 Units total) by mouth daily. (Patient not taking: Reported on 08/04/2021) 30 tablet 11   clotrimazole-betamethasone (LOTRISONE) cream Apply 1 application topically 2 (two) times daily. For chafing rash (Patient not taking: Reported on 08/04/2021) 135 g 1   doxycycline (VIBRA-TABS) 100 MG tablet Take 1 tablet (100 mg total) by mouth 2 (two) times daily. (Patient not taking: Reported on 08/04/2021) 20 tablet 1   methylPREDNISolone (MEDROL DOSEPAK) 4 MG TBPK tablet As directed - poison ivy dermatitis (Patient not taking: Reported on 08/04/2021) 21 tablet 0   triamcinolone ointment (KENALOG) 0.5 % Apply 1 application topically 2 (two) times daily. Rash or hemorrhoids (Patient not taking: Reported on 08/04/2021) 60 g 1   No facility-administered medications prior to visit.    ROS: Review of Systems  Constitutional:  Negative for appetite change, fatigue and unexpected weight change.  HENT:  Negative for congestion, nosebleeds, sneezing, sore throat and trouble swallowing.   Eyes:  Negative for itching and visual disturbance.  Respiratory:  Negative for cough.   Cardiovascular:  Negative for chest pain, palpitations and leg swelling.  Gastrointestinal:  Negative for abdominal distention, blood in stool, diarrhea and nausea.  Genitourinary:  Negative for frequency and hematuria.  Musculoskeletal:  Negative for back pain, gait problem, joint swelling and neck pain.  Skin:  Negative for rash.  Neurological:  Negative for dizziness, tremors, speech difficulty and weakness.  Psychiatric/Behavioral:  Negative for agitation, dysphoric mood and sleep disturbance. The patient is not nervous/anxious.     Objective:  BP 122/70 (BP Location: Left Arm)   Pulse 61   Temp 97.7 F (36.5 C) (Oral)   Ht 6' 0.25" (1.835 m)   Wt 177 lb (80.3 kg)   SpO2 98%   BMI 23.84 kg/m   BP Readings from Last 3 Encounters:  08/04/21 122/70  02/09/21 118/72  09/10/18 116/74    Wt Readings from Last 3 Encounters:  08/04/21 177 lb (80.3 kg)  02/09/21 196 lb 3.2 oz (89 kg)  09/10/18 170 lb (77.1 kg)    Physical Exam Constitutional:      General: He is not in acute distress.    Appearance: He is well-developed.     Comments: NAD  Eyes:     Conjunctiva/sclera: Conjunctivae normal.     Pupils: Pupils are equal, round, and reactive to light.  Neck:     Thyroid: No thyromegaly.     Vascular: No JVD.  Cardiovascular:     Rate and Rhythm: Normal rate and regular rhythm.     Heart sounds: Normal heart sounds. No murmur heard.   No friction rub. No gallop.  Pulmonary:     Effort: Pulmonary effort is normal. No respiratory distress.     Breath sounds: Normal breath sounds. No wheezing or rales.  Chest:     Chest wall: No tenderness.  Abdominal:     General: Bowel sounds are normal. There is no distension.     Palpations: Abdomen is soft. There is no mass.     Tenderness: There is no abdominal tenderness. There is no guarding or rebound.  Musculoskeletal:        General: No  tenderness. Normal range of motion.     Cervical back: Normal range of motion.  Lymphadenopathy:     Cervical: No cervical adenopathy.  Skin:    General: Skin is warm and dry.     Findings: No rash.  Neurological:     Mental Status: He is alert and oriented to person, place, and time.     Cranial Nerves: No cranial nerve deficit.     Motor: No abnormal muscle tone.     Coordination: Coordination normal.     Gait: Gait normal.     Deep Tendon Reflexes: Reflexes are normal and symmetric.  Psychiatric:        Behavior: Behavior normal.        Thought Content: Thought content normal.        Judgment: Judgment normal.     Lab Results  Component Value Date   WBC 6.2 09/14/2016   HGB 16.6 (H) 09/14/2016   HCT 47.4 09/14/2016   PLT 242.0 09/14/2016   GLUCOSE 98 09/14/2016   CHOL 148 09/14/2016   TRIG 57.0 09/14/2016   HDL 44.60 09/14/2016   LDLCALC 92 09/14/2016   ALT 20 09/14/2016   AST 19 09/14/2016   NA 140 09/14/2016   K 4.9 09/14/2016   CL 104 09/14/2016   CREATININE 1.04 09/14/2016   BUN 13 09/14/2016   CO2 30 09/14/2016   TSH 1.54 09/14/2016    CT Pelvis W Contrast  Result Date: 04/15/2003 FINDINGS CLINICAL DATA:  ABDOMINAL PAIN, NAUSEA, AND VOMITING.  LEFT SIDED HYDRONEPHROSIS AND RETROPERITONEAL MASS SEEN ON ULTRASOUND.  NOW FOR FURTHER EVALUATION. CT ABDOMEN WITHOUT AND WITH CONTRAST AND CT PELVIS WITH CONTRAST, 04/15/03 CT WAS PERFORMED THROUGH THE ABDOMEN WITHOUT AND WITH CONTRAST AND THROUIGH THE PELVIS WITH CONTRAST.  PATIENT RECEIVED ORAL CONTRAST AND 50 CC OMNIPAQUE 300 IV. THERE IS A 4.0 X 3.3 CM LOW DENSITY MASS IN THE LEFT RETROPERITONEUM/PARA-AORTIC REGION JUST BELOW THE LEFT RENAL VEIN.  THIS MEASURES 24 HOUNSFIELD UNITS ON THE PRECONTRAST, 30 HOUNSFIELD UNITS ON THE EARLY POST IV CONTRAST IMAGES, AND 42.0 CM ON THE DELAYED IMAGES.  THIS IS COMPATIBLE WITH AN ENHANCING MASS.  THIS DOES CAUSE MARKED LEFT HYDRONEPHROSIS.  THE LEFT KIDNEY DOES ENHANCE SYMMETRICALLY TO THE RIGHT AND DOES EXCRETE INTO THE DILATED COLLECTING SYSTEM.  NO OTHER MASS IS SEEN.  THIS APPEARS TO BE SEPARATE FROM THE LEFT KIDNEY ALTHOUGH IT DOES COME VERY CLOSE TO THE LOWER POLE OF THE LEFT KIDNEY.  THIS IS REMOTE FROM THE LEFT ADRENAL GLAND. NO FOCAL LESIONS SEEN IN THE LIVER, SPLEEN, PANCREAS, ADRENALS, OR RIGHT KIDNEY.  GALLBLADDER AND BOWEL ARE GROSSLY UNREMARKABLE. IMPRESSION 1.  SOLITARY 4.0 CM LOW DENSITY MASS WITH FLOW ENHANCEMENT AFTER IV CONTRAST.  THERE ARE NO CALCIFICATIONS WITHIN THIS MASS.  THIS IS SEPARATE FROM THE LEFT ADRENAL GLAND AND APPEARS TO BE SEPARATE ALTHOUGH CLOSE TO THE INFERIOR POLE OF  THE LEFT KIDNEY.  DIFFERENTIAL CONSIDERATIONS WOULD INCLUDE LYMPHADENOPATHY POSSIBLY FROM LYMPHOMA OR POSSIBLY AN EXTRAADRENAL NEUROBLASTOMA. 2.  SEVERE LEFT HYDRONEPHROSIS.  THIS IS DUE TO THE LEFT RETROPERITONEAL MASS. CT OF THE PELVIS WITH CONTRAST THERE IS A MODERATE AMOUNT OF STOOL IN THE COLON.  BLADDER IS UNREMARKABLE.  A URETERAL JET IS SEEN FROM THE RIGHT URETER BUT NOT THE LEFT ON THIS SINGLE PASS THROUGH THE PELVIS.  NO EVIDENCE OF ADENOPATHY, FREE FLUID, OR FREE AIR. IMPRESSION UNREMARKABLE PELVIC CT.  NO EVIDENCE OF ADENOPATHY IN THE PELVIS. THESE RESULTS WERE DISCUSSED DIRECTLY WITH DR.  DAVID RUBIN 04/15/03 AT 12:30 PM.  CT Abd Wo & W Cm  Result Date: 04/15/2003 FINDINGS CLINICAL DATA:  ABDOMINAL PAIN, NAUSEA, AND VOMITING.  LEFT SIDED HYDRONEPHROSIS AND RETROPERITONEAL MASS SEEN ON ULTRASOUND.  NOW FOR FURTHER EVALUATION. CT ABDOMEN WITHOUT AND WITH CONTRAST AND CT PELVIS WITH CONTRAST, 04/15/03 CT WAS PERFORMED THROUGH THE ABDOMEN WITHOUT AND WITH CONTRAST AND THROUIGH THE PELVIS WITH CONTRAST.  PATIENT RECEIVED ORAL CONTRAST AND 50 CC OMNIPAQUE 300 IV. THERE IS A 4.0 X 3.3 CM LOW DENSITY MASS IN THE LEFT RETROPERITONEUM/PARA-AORTIC REGION JUST BELOW THE LEFT RENAL VEIN.  THIS MEASURES 24 HOUNSFIELD UNITS ON THE PRECONTRAST, 30 HOUNSFIELD UNITS ON THE EARLY POST IV CONTRAST IMAGES, AND 42.0 CM ON THE DELAYED IMAGES.  THIS IS COMPATIBLE WITH AN ENHANCING MASS.  THIS DOES CAUSE MARKED LEFT HYDRONEPHROSIS.  THE LEFT KIDNEY DOES ENHANCE SYMMETRICALLY TO THE RIGHT AND DOES EXCRETE INTO THE DILATED COLLECTING SYSTEM.  NO OTHER MASS IS SEEN.  THIS APPEARS TO BE SEPARATE FROM THE LEFT KIDNEY ALTHOUGH IT DOES COME VERY CLOSE TO THE LOWER POLE OF THE LEFT KIDNEY.  THIS IS REMOTE FROM THE LEFT ADRENAL GLAND. NO FOCAL LESIONS SEEN IN THE LIVER, SPLEEN, PANCREAS, ADRENALS, OR RIGHT KIDNEY.  GALLBLADDER AND BOWEL ARE GROSSLY UNREMARKABLE. IMPRESSION 1.  SOLITARY 4.0 CM LOW DENSITY MASS WITH FLOW ENHANCEMENT  AFTER IV CONTRAST.  THERE ARE NO CALCIFICATIONS WITHIN THIS MASS.  THIS IS SEPARATE FROM THE LEFT ADRENAL GLAND AND APPEARS TO BE SEPARATE ALTHOUGH CLOSE TO THE INFERIOR POLE OF THE LEFT KIDNEY.  DIFFERENTIAL CONSIDERATIONS WOULD INCLUDE LYMPHADENOPATHY POSSIBLY FROM LYMPHOMA OR POSSIBLY AN EXTRAADRENAL NEUROBLASTOMA. 2.  SEVERE LEFT HYDRONEPHROSIS.  THIS IS DUE TO THE LEFT RETROPERITONEAL MASS. CT OF THE PELVIS WITH CONTRAST THERE IS A MODERATE AMOUNT OF STOOL IN THE COLON.  BLADDER IS UNREMARKABLE.  A URETERAL JET IS SEEN FROM THE RIGHT URETER BUT NOT THE LEFT ON THIS SINGLE PASS THROUGH THE PELVIS.  NO EVIDENCE OF ADENOPATHY, FREE FLUID, OR FREE AIR. IMPRESSION UNREMARKABLE PELVIC CT.  NO EVIDENCE OF ADENOPATHY IN THE PELVIS. THESE RESULTS WERE DISCUSSED DIRECTLY WITH DR. DAVID RUBIN 04/15/03 AT 12:30 PM.  US RENAL/AORTA  Result Date: 04/15/2003 FINDINGS CLINICAL DATA:  NAUSEA, VOMITING, ABDOMINAL PAIN. RENAL ULTRASOUND, 04/15/03 THE RIGHT KIDNEY MEASURES 8.6 CM.  THE LEFT KIDNEY IS ENLARGED AT 11.9 CM WITH MARKED LEFT HYDRONEPHROSIS.  ADJACENT TO THE UPJ REGION THERE IS A 3.2 X 3.0 X 3.0 CM ROUNDED SOFT TISSUE LESION.  THIS APPEARS TO BE SEPARATE FROM THE INFERIOR POLE OF THE LEFT KIDNEY IN THE REGION OF THE LEFT UPJ.  I WONDER IF THIS IS ADENOPATHY.  THIS WOULD REQUIRE CT FOR FURTHER EVALUATION. IMAGES OF THE BLADDER ARE UNREMARKABLE. IMPRESSION MARKED LEFT HYDRONEPHROSIS WITH ABNORMAL SOFT TISSUE ADJACENT TO THE LEFT UPJ, QUESTION ADENOPATHY. RECOMMEND CT FOR FURTHER EVALUATION. THESE RESULTS WERE DISCUSSED DIRECTLY WITH DR.Marland Kitchen DAVID RUBIN 04/15/03 AT 9:45 AM.   Assessment & Plan:     Follow-up: No follow-ups on file.  Sonda Primes, MD

## 2021-08-22 NOTE — Progress Notes (Signed)
[facility] [address] [place] [address] Phone: [phone] Subjective:   Walter Blake, am serving as a scribe for Dr. Antoine Blake.  This visit occurred during the SARS-CoV-2 public health emergency.  Safety protocols were in place, including screening questions prior to the visit, additional usage of staff PPE, and extensive cleaning of exam room while observing appropriate contact time as indicated for disinfecting solutions.    I'm seeing this patient by the request  of:  Walter Blake, Walter Quint, MD  CC: Neck and back pain  [id number]  Walter Blake is a 25 y.o. male coming in with complaint of cervical spine pain after [place]. Patient hiked entire trail this summer over 66 days. Pain between scapula. Pain is intermittent now but was constant during his hike. Patient also does slalom skiing which has exacerbated his pain. Patient is going on a hike again in 2 weeks. Does have radicular symptoms down to both hands.      No past medical history on file. No past surgical history on file. Social History   Socioeconomic History   Marital status: Single    Spouse name: Not on file   Number of children: Not on file   Years of education: Not on file   Highest education level: Not on file  Occupational History   Not on file  Tobacco Use   Smoking status: Never   Smokeless tobacco: Never  Substance and Sexual Activity   Alcohol use: No   Drug use: No   Sexual activity: Never  Other Topics Concern   Not on file  Social History Narrative   Not on file   Social Determinants of Health   Financial Resource Strain: Not on file  Food Insecurity: Not on file  Transportation Needs: Not on file  Physical Activity: Not on file  Stress: Not on file  Social Connections: Not on file     Reviewed prior external information including notes and imaging from  primary care provider As well as notes that were  available from care everywhere and other healthcare systems.  Past medical history, social, surgical and family history all reviewed in electronic medical record.  No pertanent information unless stated regarding to the chief complaint.   Review of Systems:  No headache, visual changes, nausea, vomiting, diarrhea, constipation, dizziness, abdominal pain, skin rash, fevers, chills, night sweats, weight loss, swollen lymph nodes, body aches, joint swelling, chest pain, shortness of breath, mood changes. POSITIVE muscle aches  Objective  Blood pressure 118/82, pulse 78, height 6' (1.829 m), weight 176 lb (79.8 kg).   General: No apparent distress alert and oriented x3 mood and affect normal, dressed appropriately.  HEENT: Pupils equal, extraocular movements intact  Respiratory: Patient's speak in full sentences and does not appear short of breath  Cardiovascular: No lower extremity edema, non tender, no erythema  Gait normal with good balance and coordination.  MSK: Neck exam does have some mild loss of lordosis.  Some worsening discomfort with Spurling's on the left side but no worsening radicular symptoms.  Patient does have weakness noted in the C8 distribution on the left side.  Deep tendon reflexes of the Achilles is intact. Is intact, does not have any spinous process tenderness.  He does have some tightness noted in the parascapular region.  [id number]; 15 additional minutes spent for Therapeutic exercises as stated in above notes.  This included exercises focusing on stretching, strengthening, with  significant focus on eccentric aspects.   Long term goals include an improvement in range of motion, strength, endurance as well as avoiding reinjury. Patient's frequency would include in 1-2 times a day, 3-5 times a week for a duration of 6-12 weeks.  Exercises that included:  Basic scapular stabilization to include adduction and depression of scapula Scaption, focusing on proper movement and good  control Internal and External rotation utilizing a theraband, with elbow tucked at side entire time Rows with theraband  Proper technique shown and discussed handout in great detail with ATC.  All questions were discussed and answered.     Impression and Recommendations:     The above documentation has been reviewed and is accurate and complete Walter Saa, DO

## 2021-08-23 ENCOUNTER — Other Ambulatory Visit: Payer: Self-pay

## 2021-08-23 ENCOUNTER — Encounter: Payer: Self-pay | Admitting: Family Medicine

## 2021-08-23 ENCOUNTER — Ambulatory Visit (INDEPENDENT_AMBULATORY_CARE_PROVIDER_SITE_OTHER): Payer: 59 | Admitting: Family Medicine

## 2021-08-23 ENCOUNTER — Ambulatory Visit (INDEPENDENT_AMBULATORY_CARE_PROVIDER_SITE_OTHER): Payer: 59

## 2021-08-23 VITALS — BP 118/82 | HR 78 | Ht 72.0 in | Wt 176.0 lb

## 2021-08-23 DIAGNOSIS — M546 Pain in thoracic spine: Secondary | ICD-10-CM | POA: Diagnosis not present

## 2021-08-23 DIAGNOSIS — M542 Cervicalgia: Secondary | ICD-10-CM

## 2021-08-23 MED ORDER — PREDNISONE 20 MG PO TABS: 40.0000 mg | ORAL_TABLET | Freq: Every day | ORAL | 0 refills | Status: AC

## 2021-08-23 MED ORDER — MELOXICAM 15 MG PO TABS: 15.0000 mg | ORAL_TABLET | Freq: Every day | ORAL | 0 refills | Status: DC

## 2021-08-23 MED ORDER — GABAPENTIN 100 MG PO CAPS: 100.0000 mg | ORAL_CAPSULE | Freq: Every day | ORAL | 0 refills | Status: DC

## 2021-08-23 NOTE — Patient Instructions (Signed)
Xray today Prednisone 40 daily for 5 days starting tmrw Gabapentin 100mg  at night Meloxicam 15mg  take daily for 3 days in a row if needed See me again in 4-5 weeks

## 2021-08-23 NOTE — Assessment & Plan Note (Signed)
Patient is in thoracic and neck pain.  Could potentially consider the possibility of osteopathic manipulation the patient is having radicular symptoms at this point.  We will get x-rays.  Prednisone and gabapentin given today.  Patient was found to have some mild weakness in the C8 distribution.  Discussed increasing activity slowly.  Follow-up with me again in 4 weeks.  Patient will be going on a another hiking trip and given some meloxicam for breakthrough as well.

## 2021-09-22 ENCOUNTER — Ambulatory Visit: Payer: 59 | Admitting: Family Medicine

## 2021-10-17 NOTE — Progress Notes (Signed)
[facility] [address] [place] [address] Phone: [phone] Subjective:   [name], am serving as a scribe for Dr. Antoine Primas. This visit occurred during the SARS-CoV-2 public health emergency.  Safety protocols were in place, including screening questions prior to the visit, additional usage of staff PPE, and extensive cleaning of exam room while observing appropriate contact time as indicated for disinfecting solutions.   I'm seeing this patient by the request  of:  [name], [name], MD  CC: Neck pain and upper thoracic pain follow-up  [id number]  08/23/2021 Patient is in thoracic and neck pain.  Could potentially consider the possibility of osteopathic manipulation the patient is having radicular symptoms at this point.  We will get x-rays.  Prednisone and gabapentin given today.  Patient was found to have some mild weakness in the C8 distribution.  Discussed increasing activity slowly.  Follow-up with me again in 4 weeks.  Patient will be going on a another hiking trip and given some meloxicam for breakthrough as well.  Update 10/18/2021 Walter Blake is a 25 y.o. male coming in with complaint of cervical and thoracic spine pain. (-) xrays of both areas last visit. Patient states that his pain has improved. Feels better when he is moving around vs sedentary. No issues with carrying pack.      No past medical history on file. No past surgical history on file. Social History   Socioeconomic History   Marital status: Single    Spouse name: Not on file   Number of children: Not on file   Years of education: Not on file   Highest education level: Not on file  Occupational History   Not on file  Tobacco Use   Smoking status: Never   Smokeless tobacco: Never  Substance and Sexual Activity   Alcohol use: No   Drug use: No   Sexual activity: Never  Other Topics Concern   Not on file  Social History Narrative   Not on  file   Social Determinants of Health   Financial Resource Strain: Not on file  Food Insecurity: Not on file  Transportation Needs: Not on file  Physical Activity: Not on file  Stress: Not on file  Social Connections: Not on file   No Known Allergies No family history on file.  Current Outpatient Medications (Endocrine & Metabolic):    predniSONE (DELTASONE) 20 MG tablet, Take 2 tablets (40 mg total) by mouth daily with breakfast.    Current Outpatient Medications (Analgesics):    meloxicam (MOBIC) 15 MG tablet, Take 1 tablet (15 mg total) by mouth daily.   Current Outpatient Medications (Other):    gabapentin (NEURONTIN) 100 MG capsule, Take 1 capsule (100 mg total) by mouth at bedtime.    Objective  Blood pressure 122/72, pulse 70, height 6' (1.829 m), weight 178 lb (80.7 kg), SpO2 98 %.   General: No apparent distress alert and oriented x3 mood and affect normal, dressed appropriately.  HEENT: Pupils equal, extraocular movements intact  Respiratory: Patient's speak in full sentences and does not appear short of breath  Cardiovascular: No lower extremity edema, non tender, no erythema  Gait normal with good balance and coordination.  MSK: Patient's neck exam does have good range of motion noted today.  Negative Spurling's.  Patient does have improvement in posture somewhat.  Patient does have significant improvement in strength with 4+ out of 5 strength in the C8 distribution on the right  compared to the left.  Otherwise full strength.  Deep tendon reflexes intact    Impression and Recommendations:     The above documentation has been reviewed and is accurate and complete Judi Saa, DO

## 2021-10-18 ENCOUNTER — Other Ambulatory Visit: Payer: Self-pay

## 2021-10-18 ENCOUNTER — Ambulatory Visit (INDEPENDENT_AMBULATORY_CARE_PROVIDER_SITE_OTHER): Payer: 59 | Admitting: Family Medicine

## 2021-10-18 ENCOUNTER — Encounter: Payer: Self-pay | Admitting: Family Medicine

## 2021-10-18 DIAGNOSIS — M546 Pain in thoracic spine: Secondary | ICD-10-CM

## 2021-10-18 NOTE — Patient Instructions (Signed)
Great to see you  You are doing great  Can take gabapentin as needed Stay active and continue the exercises See me again in 2 months if not perfect

## 2021-10-18 NOTE — Assessment & Plan Note (Signed)
Patient was having signs and symptoms concerning for more of a C8 and T1 distribution of her radicular symptoms but now is having significant decrease in pain and radicular symptoms.  Did respond well to the gabapentin.  Encouraged him to continue the exercises.  Follow-up again in 2 months to make sure it completely resolved at that time

## 2021-12-19 NOTE — Progress Notes (Deleted)
°  [facility] [address] [place] [address] Phone: [phone] Subjective:    I'm seeing this patient by the request  of:  [name], [name], MD  CC:   [id number]  10/18/2021 Patient was having signs and symptoms concerning for more of a C8 and T1 distribution of her radicular symptoms but now is having significant decrease in pain and radicular symptoms.  Did respond well to the gabapentin.  Encouraged him to continue the exercises.  Follow-up again in 2 months to make sure it completely resolved at that time  Update 12/20/2021 Walter Blake is a 25 y.o. male coming in with complaint of thoracic spine pain. Patient states        No past medical history on file. No past surgical history on file. Social History   Socioeconomic History   Marital status: Single    Spouse name: Not on file   Number of children: Not on file   Years of education: Not on file   Highest education level: Not on file  Occupational History   Not on file  Tobacco Use   Smoking status: Never   Smokeless tobacco: Never  Substance and Sexual Activity   Alcohol use: No   Drug use: No   Sexual activity: Never  Other Topics Concern   Not on file  Social History Narrative   Not on file   Social Determinants of Health   Financial Resource Strain: Not on file  Food Insecurity: Not on file  Transportation Needs: Not on file  Physical Activity: Not on file  Stress: Not on file  Social Connections: Not on file   No Known Allergies No family history on file.  Current Outpatient Medications (Endocrine & Metabolic):    predniSONE (DELTASONE) 20 MG tablet, Take 2 tablets (40 mg total) by mouth daily with breakfast.    Current Outpatient Medications (Analgesics):    meloxicam (MOBIC) 15 MG tablet, Take 1 tablet (15 mg total) by mouth daily.   Current Outpatient Medications (Other):    gabapentin (NEURONTIN) 100 MG capsule, Take 1 capsule (100 mg  total) by mouth at bedtime.   Reviewed prior external information including notes and imaging from  primary care provider As well as notes that were available from care everywhere and other healthcare systems.  Past medical history, social, surgical and family history all reviewed in electronic medical record.  No pertanent information unless stated regarding to the chief complaint.   Review of Systems:  No headache, visual changes, nausea, vomiting, diarrhea, constipation, dizziness, abdominal pain, skin rash, fevers, chills, night sweats, weight loss, swollen lymph nodes, body aches, joint swelling, chest pain, shortness of breath, mood changes. POSITIVE muscle aches  Objective  There were no vitals taken for this visit.   General: No apparent distress alert and oriented x3 mood and affect normal, dressed appropriately.  HEENT: Pupils equal, extraocular movements intact  Respiratory: Patient's speak in full sentences and does not appear short of breath  Cardiovascular: No lower extremity edema, non tender, no erythema  Gait normal with good balance and coordination.  MSK:  Non tender with full range of motion and good stability and symmetric strength and tone of shoulders, elbows, wrist, hip, knee and ankles bilaterally.     Impression and Recommendations:     The above documentation has been reviewed and is accurate and complete [name]

## 2021-12-20 ENCOUNTER — Ambulatory Visit: Payer: 59 | Admitting: Family Medicine

## 2022-02-08 ENCOUNTER — Encounter: Payer: Self-pay | Admitting: Family Medicine

## 2022-04-23 ENCOUNTER — Encounter: Payer: Self-pay | Admitting: Family Medicine

## 2022-04-23 ENCOUNTER — Other Ambulatory Visit: Payer: Self-pay | Admitting: Family Medicine

## 2022-04-24 ENCOUNTER — Other Ambulatory Visit: Payer: Self-pay | Admitting: Family Medicine

## 2022-04-24 MED ORDER — MELOXICAM 15 MG PO TABS: 15.0000 mg | ORAL_TABLET | Freq: Every day | ORAL | 0 refills | Status: AC

## 2022-04-24 MED ORDER — GABAPENTIN 100 MG PO CAPS: 100.0000 mg | ORAL_CAPSULE | Freq: Every day | ORAL | 0 refills | Status: AC

## 2022-11-14 IMAGING — DX DG CERVICAL SPINE 2 OR 3 VIEWS
3 series · 3 of 3 positions shown · non-contrast
Comparison: None.

CLINICAL DATA: Cervical spine pain.

EXAM:
CERVICAL SPINE - 2-3 VIEW

[c-spine lat]
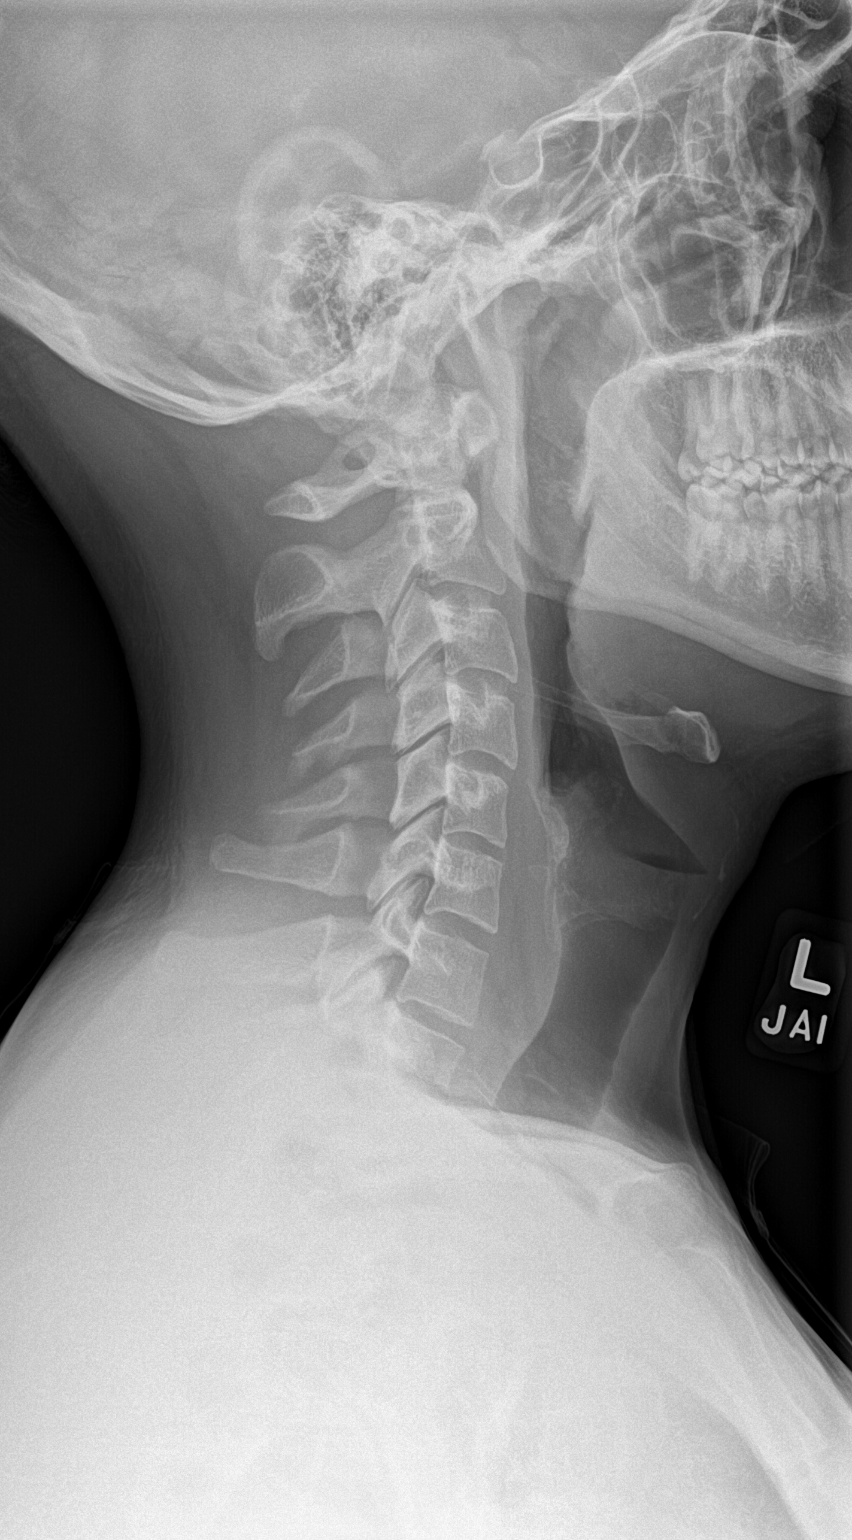

[c-spine ap]
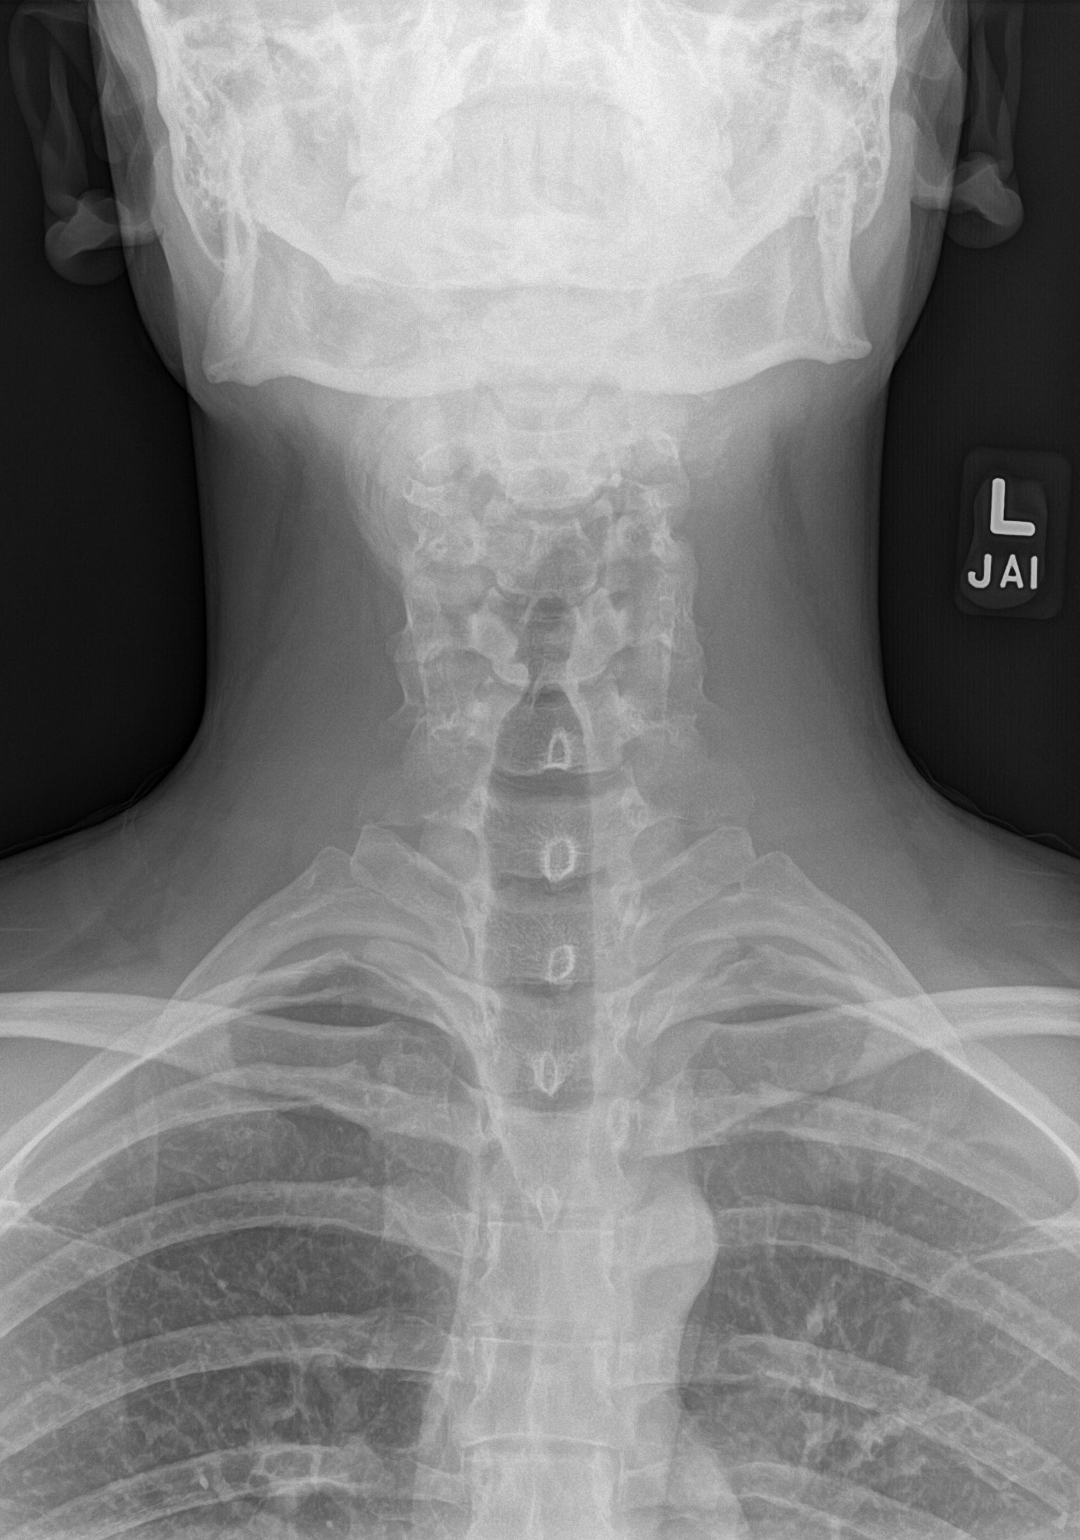

[c-spine open mouth]
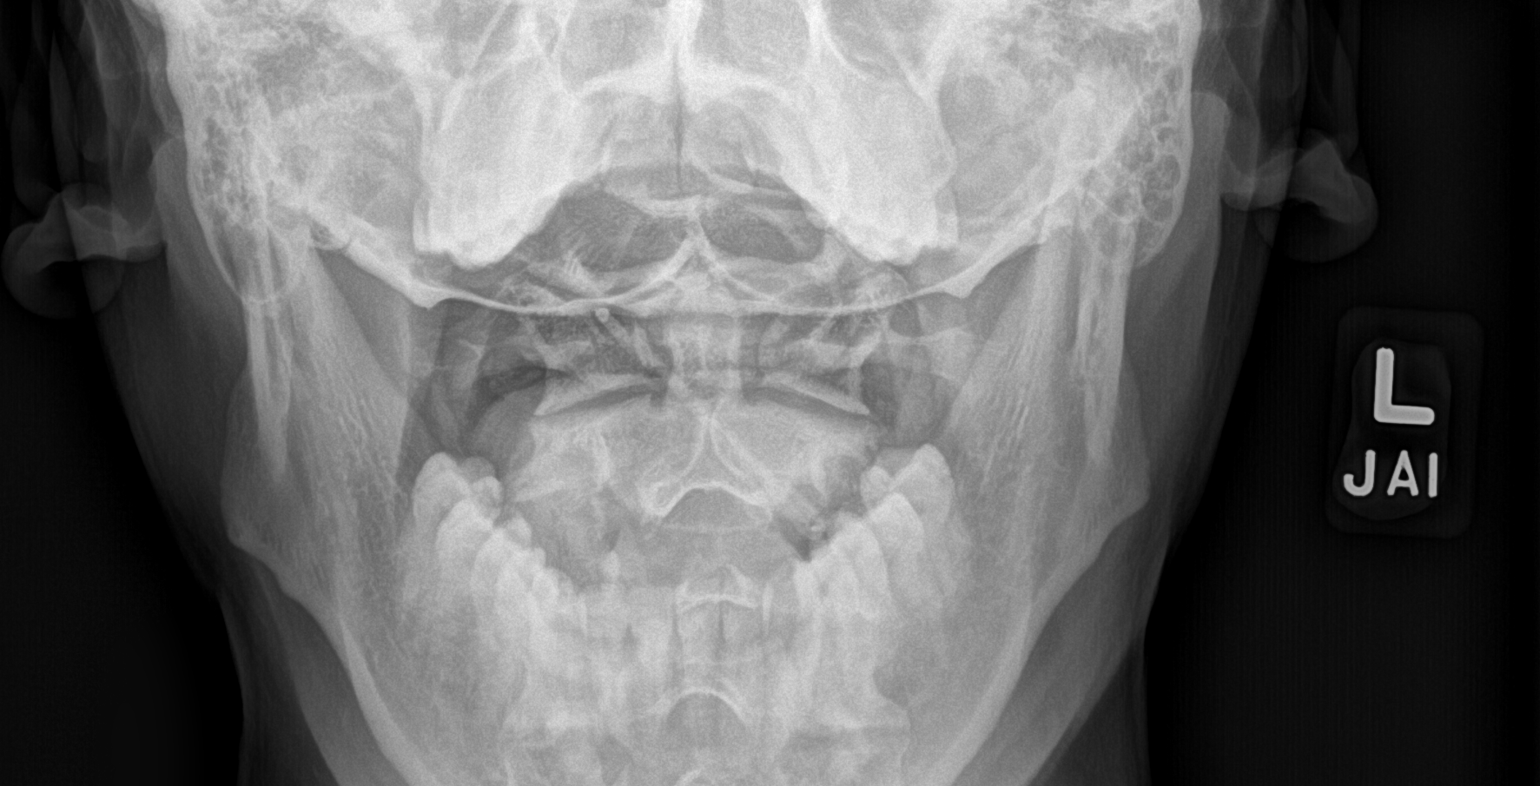

[3 of 3 positions shown; findings below may reference images not displayed]

FINDINGS: Cervical spine alignment is maintained. Vertebral body heights and
intervertebral disc spaces are preserved. The dens is intact.
Posterior elements appear well-aligned. There is no evidence of
fracture, focal bone lesion or bony destruction. No prevertebral
soft tissue edema.
IMPRESSION: Negative radiographs of the cervical spine.

## 2022-11-14 IMAGING — DX DG THORACIC SPINE 2V
2 series · 2 of 2 positions shown · non-contrast
Comparison: None.

CLINICAL DATA: Mid back pain. Thoracic back pain occasionally
radiating into both arms.

EXAM:
THORACIC SPINE 2 VIEWS

[t-spine ap]
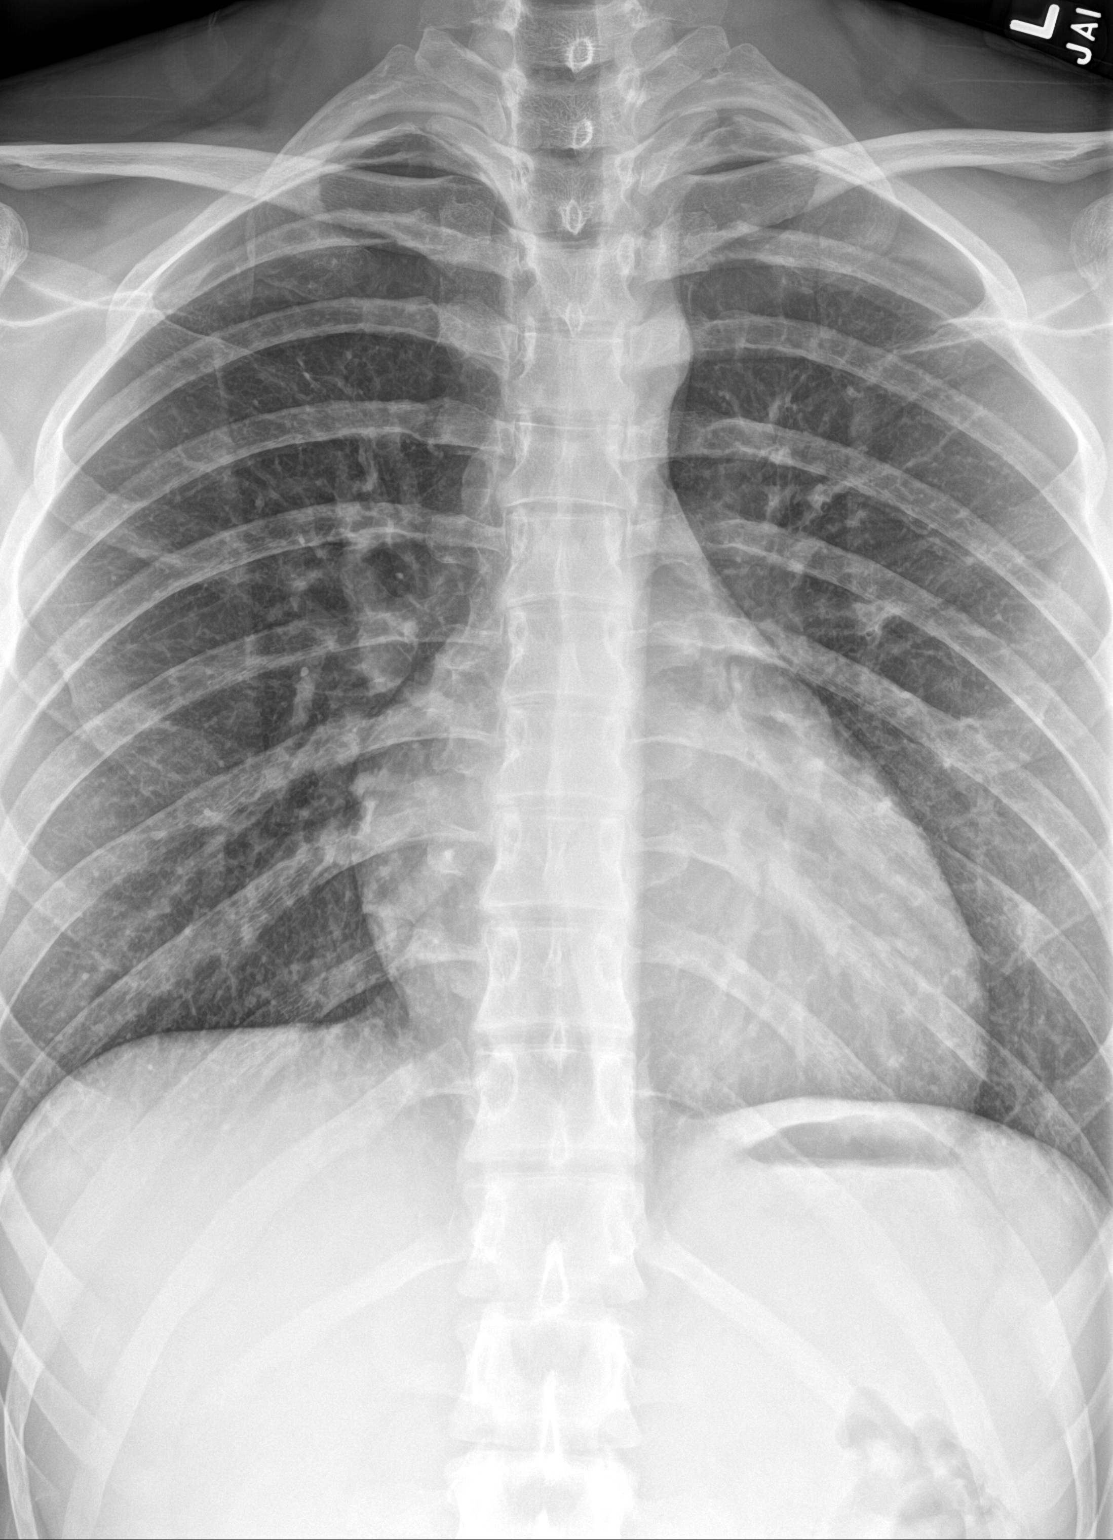

[t-spine lat]
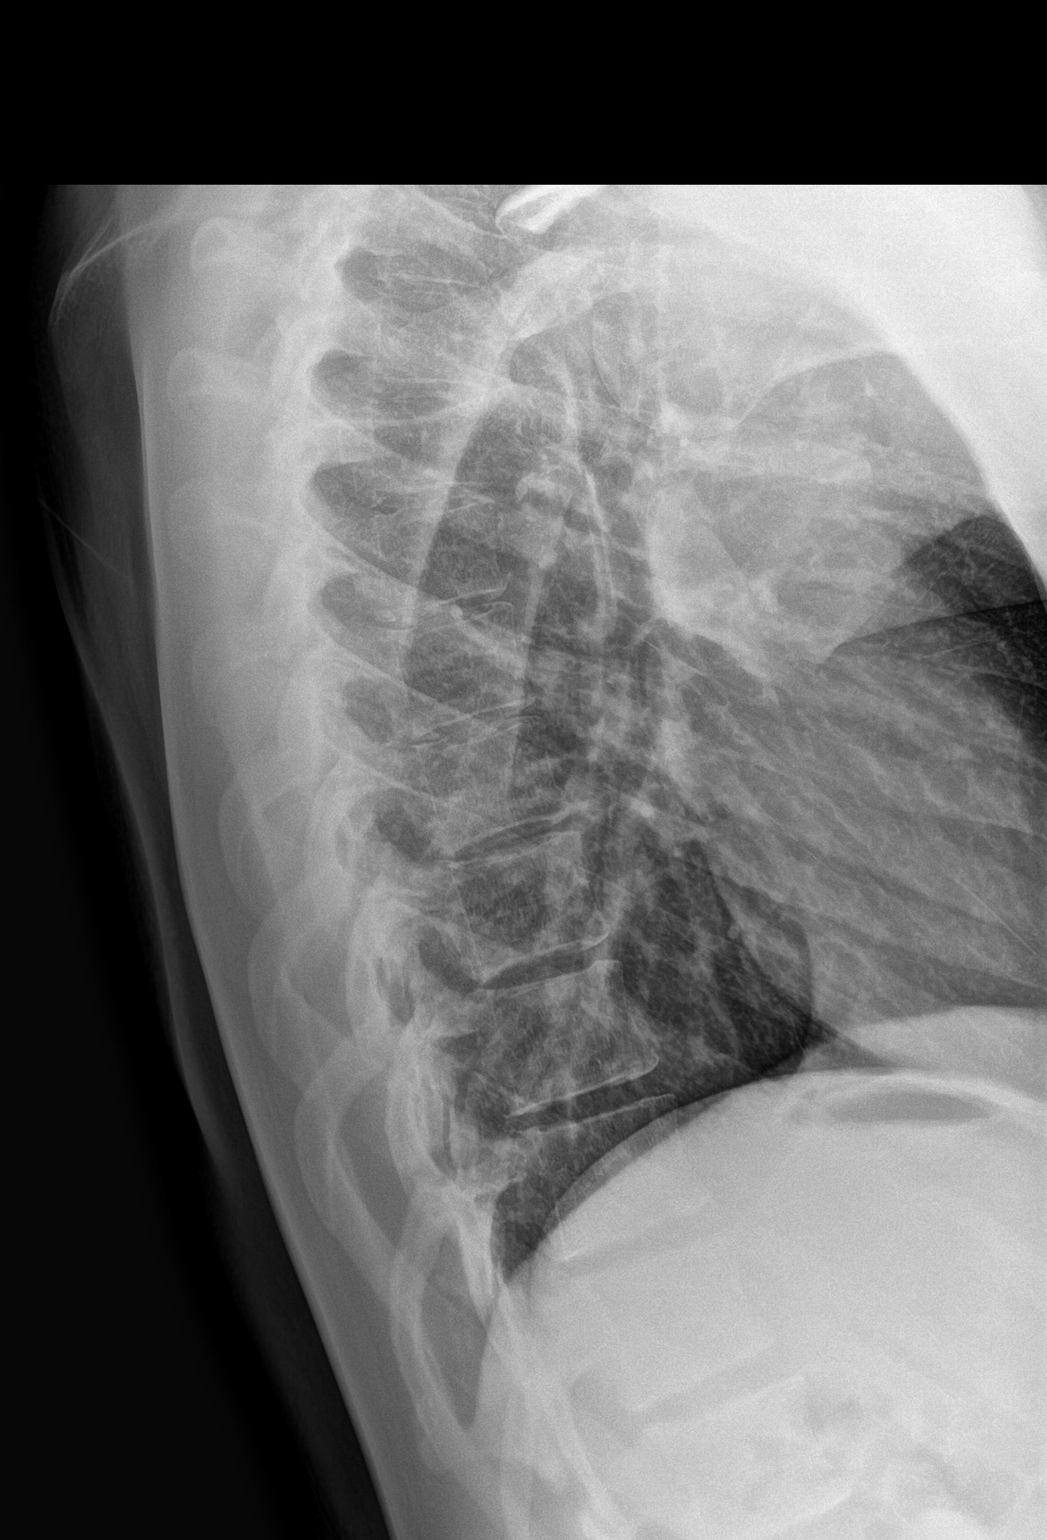

[2 of 2 positions shown; findings below may reference images not displayed]

FINDINGS: Twelve rib-bearing thoracic vertebra. The alignment is maintained.
Vertebral body heights are maintained. No significant disc space
narrowing. No evidence of fracture, focal bone lesion or bony
destruction. Posterior elements appear intact. There is no
paravertebral soft tissue abnormality.
IMPRESSION: Negative radiographs of the thoracic spine.
